# Patient Record
Sex: Female | Born: 1995 | Race: White | Hispanic: No | Marital: Single | Smoking: Former smoker
Health system: Southern US, Community
[De-identification: ages and names within clinical notes are randomized; demographics above are authoritative.]

## PROBLEM LIST (undated history)

## (undated) DIAGNOSIS — Z8709 Personal history of other diseases of the respiratory system: Secondary | ICD-10-CM

## (undated) DIAGNOSIS — R55 Syncope and collapse: Secondary | ICD-10-CM

## (undated) HISTORY — PX: DILATION AND CURETTAGE OF UTERUS: SHX78

## (undated) HISTORY — DX: Syncope and collapse: R55

## (undated) HISTORY — DX: Personal history of other diseases of the respiratory system: Z87.09

---
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
## 2016-03-12 NOTE — L&D Delivery Note (Signed)
Patient is 21 y.o. G2P0010 [redacted]w[redacted]d admitted for SOL. AROM at 1051.    Delivery Note At 9:09 PM a viable female was delivered via Vaginal, Vacuum (Extractor) (Presentation: ROA).  APGAR: 8, 9; weight pending.   Placenta status: spontaneous, intact.  Cord: 3 vessel  Anesthesia:  Epidural Episiotomy: None Lacerations: Labial;1st degree Suture Repair: 3.0 vicryl Est. Blood Loss (mL): 250  Mom to postpartum.  Baby to Couplet care / Skin to Skin.    Upon arrival patient was complete and pushing. She pushed with good maternal effort for three hours and baby remained at +3 station.  Risks and benefits of vaccuum assist were discussed with patient and she agreed to proceed.  A Kiwi suction was applied to neonate head and traction applied during maternal pushing effort.  A  viable female infant in cephalic, ROA position was then delivered without complication. No nuchal cord present.  Baby delivered without difficulty (anterior shoulder delivered with ease), was noted to have good tone and placed on maternal abdomen for oral suctioning, drying and stimulation. Delayed cord clamping performed. Placenta delivered spontaneously with gentle cord traction. Fundus firm with massage and Pitocin. Perineum inspected and found to have right labial first degree laceration, which was repaired with 3-0 vicryl with adequate hemostasis achieved.  Counts of sharps, instruments, and lap pads were all correct.   Larene Beach, DO PGY-2 Family Medicine Resident 11/24/2016, 9:26 PM

## 2016-07-06 ENCOUNTER — Encounter: Payer: Self-pay | Admitting: Certified Nurse Midwife

## 2016-07-18 ENCOUNTER — Ambulatory Visit (INDEPENDENT_AMBULATORY_CARE_PROVIDER_SITE_OTHER): Payer: Medicaid Other | Admitting: Certified Nurse Midwife

## 2016-07-18 ENCOUNTER — Other Ambulatory Visit (HOSPITAL_COMMUNITY)
Admission: RE | Admit: 2016-07-18 | Discharge: 2016-07-18 | Disposition: A | Discharge status: Home / Self Care | Payer: Medicaid Other | Source: Ambulatory Visit | Attending: Certified Nurse Midwife | Admitting: Certified Nurse Midwife

## 2016-07-18 ENCOUNTER — Encounter: Payer: Self-pay | Admitting: Certified Nurse Midwife

## 2016-07-18 VITALS — BP 110/65 | HR 91 | Ht 62.0 in | Wt 134.2 lb

## 2016-07-18 DIAGNOSIS — Z3481 Encounter for supervision of other normal pregnancy, first trimester: Secondary | ICD-10-CM | POA: Diagnosis not present

## 2016-07-18 DIAGNOSIS — O0930 Supervision of pregnancy with insufficient antenatal care, unspecified trimester: Secondary | ICD-10-CM

## 2016-07-18 DIAGNOSIS — B9689 Other specified bacterial agents as the cause of diseases classified elsewhere: Secondary | ICD-10-CM | POA: Insufficient documentation

## 2016-07-18 DIAGNOSIS — Z3480 Encounter for supervision of other normal pregnancy, unspecified trimester: Secondary | ICD-10-CM | POA: Insufficient documentation

## 2016-07-18 DIAGNOSIS — O0931 Supervision of pregnancy with insufficient antenatal care, first trimester: Secondary | ICD-10-CM

## 2016-07-18 DIAGNOSIS — Z87898 Personal history of other specified conditions: Secondary | ICD-10-CM

## 2016-07-18 MED ORDER — PRENATE PIXIE 10-0.6-0.4-200 MG PO CAPS: 1.0000 | ORAL_CAPSULE | Freq: Every day | ORAL | 12 refills | Status: DC

## 2016-07-18 NOTE — Progress Notes (Signed)
Patient reports fetal movement, denies pain, bleeding and contractions.

## 2016-07-18 NOTE — Progress Notes (Signed)
Subjective:    Charlene Joseph is being seen today for her first obstetrical visit.  This is not a planned pregnancy but happy about. She is at 2344w4d gestation. Her obstetrical history is significant for late to prenatal care, asthma as a young child, syncopy,and dilation & curretage. Relationship with FOB: significant other, living together, Christ present at visit today.  Patient does intend to breast and bottle feed. Pregnancy history fully reviewed.  The information documented in the HPI was reviewed and verified.  Menstrual History: OB History    Gravida Para Term Preterm AB Living   2       1 0   SAB TAB Ectopic Multiple Live Births     1            Menarche age: [age] Patient's last menstrual period was 02/25/2016.  Period is irregular, lasts from 3 to 7 days with moderate to light flow. Patient does report dysmenorrhea after TAB with  Her menstrual cycle. Period Duration (Days): 3 to 7 Period Pattern: (!) Irregular Menstrual Flow: Moderate Menstrual Control: Maxi pad, Tampon Menstrual Control Change Freq (Hours): every 2 hours  Dysmenorrhea: (!) Severe Dysmenorrhea Symptoms: Cramping, Headache, Nausea  Past Medical History:  Diagnosis Date   Asthma  (young child)  . Syncope 2011    Past Surgical History:  Procedure Laterality Date  . DILATION AND CURETTAGE OF UTERUS       (Not in a hospital admission) No Known Allergies  Social History  Substance Use Topics  . Smoking status: Former Smoker    Types: Cigarettes    Quit date: 2016  . Smokeless tobacco: Never Used  . Alcohol use No    Family History  Problem Relation Age of Onset  . Diabetes Maternal Grandmother      Review of Systems Constitutional: negative for weight loss Gastrointestinal: negative for vomiting,  Genitourinary:negative for genital lesions and vaginal discharge and dysuria Musculoskeletal:negative for back pain Behavioral/Psych: negative for abusive relationship, depression, illegal drug usage  and tobacco use    Objective:    BP 110/65   Pulse 91   Ht 5\' 2"  (1.575 m)   Wt 134 lb 3.2 oz (60.9 kg)   LMP 02/25/2016   BMI 24.55 kg/m  General Appearance:    Alert, cooperative, no distress, appears stated age  Head:    Normocephalic, without obvious abnormality, atraumatic  Eyes:    PERRL, conjunctiva/corneas clear, EOM's intact, fundi    benign, both eyes  Ears:    Normal TM's and external ear canals, both ears  Nose:   Nares normal, septum midline, mucosa normal, no drainage    or sinus tenderness  Throat:   Lips, mucosa, and tongue normal; teeth and gums normal  Neck:   Supple, symmetrical, trachea midline, no adenopathy;    thyroid:  no enlargement/tenderness/nodules; no carotid   bruit or JVD  Back:     Symmetric, no curvature, ROM normal, no CVA tenderness  Lungs:     Clear to auscultation bilaterally, respirations unlabored  Chest Wall:    No tenderness or deformity   Heart:    Regular rate and rhythm, S1 and S2 normal, no murmur, rub   or gallop  Breast Exam:    No tenderness, masses, or nipple abnormality  Abdomen:     Soft, non-tender, tenderness noted in lower left quadrant , bowel sounds active all four quadrants,    no masses, no organomegaly  Genitalia:    Normal female without lesion, discharge  or tenderness  Extremities:   Extremities normal, atraumatic, no cyanosis or edema  Pulses:   2+ and symmetric all extremities  Skin:   Skin color, texture, turgor normal, no rashes or lesions  Lymph nodes:   Cervical, supraclavicular, and axillary nodes normal  Neurologic:   CNII-XII intact, normal strength, sensation and reflexes    throughout     Cervix: long thick closed and posterior. FHR: 152 by doppler. FH: 20cm    Lab Review Urine pregnancy test n/a Labs reviewed n/a Radiologic studies reviewed yes Assessment:    Pregnancy at [redacted]w[redacted]d weeks ,S=D   1. Supervision of other normal pregnancy, antepartum      Doing well - Cervicovaginal ancillary only -  Hemoglobin A1c - Hemoglobinopathy evaluation - Vitamin D (25 hydroxy) - Obstetric Panel, Including HIV - MaterniT21 PLUS Core+SCA - Culture, OB Urine - Varicella zoster antibody, IgG - Cystic Fibrosis Mutation 97 - AFP, Serum, Open Spina Bifida - [place] MFM OB COMP + 14 WK; Future - Prenat-FeAsp-Meth-FA-DHA w/o A (PRENATE PIXIE) 10-0.6-0.4-200 MG CAPS; Take 1 tablet by mouth daily.  Dispense: 30 capsule; Refill: 12  2. Late prenatal care     At 20 weeks  3. Hx of syncope      No recent episodes.  - Ambulatory referral to Cardiology   Prenatal vitamins.  Counseling provided regarding continued use of seat belts, cessation of alcohol consumption, smoking or use of illicit drugs; infection precautions i.e., influenza/TDAP immunizations, toxoplasmosis,CMV, parvovirus, listeria and varicella; workplace safety, exercise during pregnancy; routine dental care, safe medications, sexual activity, hot tubs, saunas, pools, travel, caffeine use, fish and methlymercury, potential toxins, hair treatments, varicose veins Weight gain recommendations per IOM guidelines reviewed: underweight/BMI< 18.5--> gain 28 - 40 lbs; normal weight/BMI 18.5 - 24.9--> gain 25 - 35 lbs; overweight/BMI 25 - 29.9--> gain 15 - 25 lbs; obese/BMI >30->gain  11 - 20 lbs Problem list reviewed and updated. FIRST/CF mutation testing/NIPT/QUAD SCREEN/fragile X/[name] population testing/Spinal muscular atrophy discussed: requested. Role of ultrasound in pregnancy discussed; fetal survey: requested. Amniocentesis discussed: not indicated at this time..   Meds ordered this encounter  Medications  . Prenatal Vit-Fe Fumarate-FA (MULTIVITAMIN-PRENATAL) 27-0.8 MG TABS tablet    Sig: Take 1 tablet by mouth daily at 12 noon.  . Prenat-FeAsp-Meth-FA-DHA w/o A (PRENATE PIXIE) 10-0.6-0.4-200 MG CAPS    Sig: Take 1 tablet by mouth daily.    Dispense:  30 capsule    Refill:  12         Follow up in 4 weeks ROB 50% of 30 min  visit spent on counseling and coordination of care.

## 2016-07-19 LAB — CERVICOVAGINAL ANCILLARY ONLY
BACTERIAL VAGINITIS: POSITIVE — AB
Candida vaginitis: NEGATIVE
Chlamydia: NEGATIVE
Neisseria Gonorrhea: NEGATIVE
Trichomonas: NEGATIVE

## 2016-07-22 ENCOUNTER — Other Ambulatory Visit: Payer: Self-pay | Admitting: Certified Nurse Midwife

## 2016-07-22 DIAGNOSIS — B9689 Other specified bacterial agents as the cause of diseases classified elsewhere: Secondary | ICD-10-CM

## 2016-07-22 DIAGNOSIS — N760 Acute vaginitis: Principal | ICD-10-CM

## 2016-07-22 LAB — CULTURE, OB URINE

## 2016-07-22 LAB — URINE CULTURE, OB REFLEX

## 2016-07-22 MED ORDER — METRONIDAZOLE 500 MG PO TABS: 500.0000 mg | ORAL_TABLET | Freq: Two times a day (BID) | ORAL | 0 refills | Status: DC

## 2016-07-23 ENCOUNTER — Other Ambulatory Visit: Payer: Self-pay | Admitting: Certified Nurse Midwife

## 2016-07-23 DIAGNOSIS — Z3480 Encounter for supervision of other normal pregnancy, unspecified trimester: Secondary | ICD-10-CM

## 2016-07-23 LAB — MATERNIT21 PLUS CORE+SCA
CHROMOSOME 13: NEGATIVE
CHROMOSOME 18: NEGATIVE
CHROMOSOME 21: NEGATIVE
Y Chromosome: DETECTED

## 2016-07-25 ENCOUNTER — Other Ambulatory Visit: Payer: Self-pay | Admitting: Certified Nurse Midwife

## 2016-07-25 DIAGNOSIS — Z3480 Encounter for supervision of other normal pregnancy, unspecified trimester: Secondary | ICD-10-CM

## 2016-07-25 LAB — AFP, SERUM, OPEN SPINA BIFIDA
AFP MOM: 0.79
AFP VALUE AFPOSL: 47.7 ng/mL
Gest. Age on Collection Date: 20 weeks
Maternal Age At EDD: 21.1 yr
OSBR RISK 1 IN: 10000
Test Results:: NEGATIVE
Weight: 134 [lb_av]

## 2016-07-25 LAB — OBSTETRIC PANEL, INCLUDING HIV
ANTIBODY SCREEN: NEGATIVE
BASOS: 0 %
Basophils Absolute: 0 10*3/uL (ref 0.0–0.2)
EOS (ABSOLUTE): 0 10*3/uL (ref 0.0–0.4)
Eos: 0 %
HEMATOCRIT: 35.7 % (ref 34.0–46.6)
HEP B S AG: NEGATIVE
HIV SCREEN 4TH GENERATION: NONREACTIVE
Hemoglobin: 12.4 g/dL (ref 11.1–15.9)
Immature Grans (Abs): 0 10*3/uL (ref 0.0–0.1)
Immature Granulocytes: 0 %
Lymphocytes Absolute: 2.3 10*3/uL (ref 0.7–3.1)
Lymphs: 24 %
MCH: 32.1 pg (ref 26.6–33.0)
MCHC: 34.7 g/dL (ref 31.5–35.7)
MCV: 93 fL (ref 79–97)
MONOCYTES: 6 %
Monocytes Absolute: 0.6 10*3/uL (ref 0.1–0.9)
NEUTROS ABS: 6.5 10*3/uL (ref 1.4–7.0)
Neutrophils: 70 %
Platelets: 223 10*3/uL (ref 150–379)
RBC: 3.86 x10E6/uL (ref 3.77–5.28)
RDW: 13.1 % (ref 12.3–15.4)
RH TYPE: POSITIVE
RPR: NONREACTIVE
RUBELLA: 2.42 {index} (ref >0.99)
WBC: 9.4 10*3/uL (ref 3.4–10.8)

## 2016-07-25 LAB — CYSTIC FIBROSIS MUTATION 97: Interpretation: NOT DETECTED

## 2016-07-25 LAB — HEMOGLOBINOPATHY EVALUATION
HEMOGLOBIN F QUANTITATION: 0 % (ref 0.0–2.0)
HGB A: 97.3 % (ref 96.4–98.8)
HGB C: 0 %
HGB S: 0 %
HGB VARIANT: 0 %
Hemoglobin A2 Quantitation: 2.7 % (ref 1.8–3.2)

## 2016-07-25 LAB — VITAMIN D 25 HYDROXY (VIT D DEFICIENCY, FRACTURES): VIT D 25 HYDROXY: 41.9 ng/mL (ref 30.0–100.0)

## 2016-07-25 LAB — HEMOGLOBIN A1C
ESTIMATED AVERAGE GLUCOSE: 94 mg/dL
Hgb A1c MFr Bld: 4.9 % (ref 4.8–5.6)

## 2016-07-25 LAB — VARICELLA ZOSTER ANTIBODY, IGG: VARICELLA: 3914 {index} (ref >165)

## 2016-07-30 ENCOUNTER — Ambulatory Visit (HOSPITAL_COMMUNITY)
Admission: RE | Admit: 2016-07-30 | Discharge: 2016-07-30 | Disposition: A | Discharge status: Home / Self Care | Payer: Medicaid Other | Source: Ambulatory Visit | Attending: Certified Nurse Midwife | Admitting: Certified Nurse Midwife

## 2016-07-30 ENCOUNTER — Other Ambulatory Visit: Payer: Self-pay | Admitting: Certified Nurse Midwife

## 2016-07-30 DIAGNOSIS — Z363 Encounter for antenatal screening for malformations: Secondary | ICD-10-CM | POA: Diagnosis present

## 2016-07-30 DIAGNOSIS — Z3480 Encounter for supervision of other normal pregnancy, unspecified trimester: Secondary | ICD-10-CM | POA: Insufficient documentation

## 2016-07-30 DIAGNOSIS — Z3A22 22 weeks gestation of pregnancy: Secondary | ICD-10-CM | POA: Diagnosis not present

## 2016-07-30 DIAGNOSIS — O0932 Supervision of pregnancy with insufficient antenatal care, second trimester: Secondary | ICD-10-CM | POA: Insufficient documentation

## 2016-08-02 ENCOUNTER — Other Ambulatory Visit: Payer: Self-pay | Admitting: Certified Nurse Midwife

## 2016-08-02 DIAGNOSIS — Z3480 Encounter for supervision of other normal pregnancy, unspecified trimester: Secondary | ICD-10-CM

## 2016-08-07 ENCOUNTER — Encounter: Payer: Self-pay | Admitting: Cardiology

## 2016-08-08 ENCOUNTER — Encounter: Payer: Self-pay | Admitting: Cardiology

## 2016-08-08 ENCOUNTER — Ambulatory Visit (INDEPENDENT_AMBULATORY_CARE_PROVIDER_SITE_OTHER): Payer: Medicaid Other | Admitting: Cardiology

## 2016-08-08 VITALS — BP 104/68 | HR 84 | Ht 62.0 in | Wt 138.4 lb

## 2016-08-08 DIAGNOSIS — R079 Chest pain, unspecified: Secondary | ICD-10-CM

## 2016-08-08 DIAGNOSIS — R55 Syncope and collapse: Secondary | ICD-10-CM

## 2016-08-08 NOTE — Patient Instructions (Signed)
Medication Instructions:  Your physician recommends that you continue on your current medications as directed. Please refer to the Current Medication list given to you today.   Labwork: None Ordered   Testing/Procedures: Your physician has requested that you have an echocardiogram. Echocardiography is a painless test that uses sound waves to create images of your heart. It provides your doctor with information about the size and shape of your heart and how well your heart's chambers and valves are working. This procedure takes approximately one hour. There are no restrictions for this procedure.    Follow-Up: Your physician recommends that you schedule a follow-up appointment in: as needed  Any Other Special Instructions Will Be Listed Below (If Applicable).     If you need a refill on your cardiac medications before your next appointment, please call your pharmacy.   Thank you for choosing  [facility]  Corrine CMA AAMA

## 2016-08-08 NOTE — Progress Notes (Signed)
Cardiology Office Note NEW PATIENT VISIT   Date:  08/08/2016   ID:  Charlene Joseph, DOB Jun 18, 1995, MRN [id number]  PCP:  Patient, No Pcp Per  Cardiologist:  New Dr. Eldridge Dace    Chief Complaint  Patient presents with  . Loss of Consciousness  . Shortness of Breath      History of Present Illness: Charlene Joseph is a 21 y.o. female who presents for hx of syncope now pregnant.   No recent episodes of syncope.   She is [redacted] weeks pregnant due 12/01/16.  She has hx of syncope in middle school several times usually when out in field playing.  She did have an echo back then.  Syncope then described as feeling hollow in arms and legs and then passes out.  No syncope in some time but now she has episodes of feeling lightheaded and the hollowness in arms and legs.  We discussed importance of keeping hydrated.  No palpitations.  Some SOB.  occ chest pain.   Today she found out she is having a boy.  Past Medical History:  Diagnosis Date  . History of asthma    patient had as child ,been about 6 to 7 years sine one.   . Syncope 2011    Past Surgical History:  Procedure Laterality Date  . DILATION AND CURETTAGE OF UTERUS       Current Outpatient Prescriptions  Medication Sig Dispense Refill  . metroNIDAZOLE (FLAGYL) 500 MG tablet Take 1 tablet (500 mg total) by mouth 2 (two) times daily. 14 tablet 0  . Prenat-FeAsp-Meth-FA-DHA w/o A (PRENATE PIXIE) 10-0.6-0.4-200 MG CAPS Take 1 tablet by mouth daily. 30 capsule 12  . Prenatal Vit-Fe Fumarate-FA (MULTIVITAMIN-PRENATAL) 27-0.8 MG TABS tablet Take 1 tablet by mouth daily at 12 noon.     No current facility-administered medications for this visit.     Allergies:   Patient has no known allergies.    Social History:  The patient  reports that she quit smoking about 2 years ago. Her smoking use included Cigarettes. She has never used smokeless tobacco. She reports that she uses drugs, including Marijuana. She reports that she does not drink  alcohol.   Family History:  The patient's family history includes Diabetes in her maternal grandmother; Healthy in her brother and mother.    ROS:  General:no colds or fevers, + weight gain with pregnancy Skin:no rashes or ulcers HEENT:no blurred vision, no congestion CV:see HPI PUL:see HPI GI:no diarrhea constipation or melena, no indigestion GU:no hematuria, no dysuria MS:no joint pain, no claudication Neuro:no syncope, no lightheadedness Endo:no diabetes, no thyroid disease  Wt Readings from Last 3 Encounters:  08/08/16 138 lb 6.4 oz (62.8 kg)  07/18/16 134 lb 3.2 oz (60.9 kg)     PHYSICAL EXAM: VS:  BP 104/68   Pulse 84   Ht 5\' 2"  (1.575 m)   Wt 138 lb 6.4 oz (62.8 kg)   LMP 02/25/2016   SpO2 98%   BMI 25.31 kg/m  , BMI Body mass index is 25.31 kg/m. General:Pleasant affect, NAD Skin:Warm and dry, brisk capillary refill HEENT:normocephalic, sclera clear, mucus membranes moist Neck:supple, no JVD, no bruits  Heart:S1S2 RRR without murmur, gallup, rub or click Lungs:clear without rales, rhonchi, or wheezes WUJ:WJXB, non tender, + BS, do not palpate liver spleen or masses Ext:no lower ext edema, 2+ pedal pulses, 2+ radial pulses Neuro:alert and oriented, MAE, follows commands, + facial symmetry    EKG:  EKG is ordered today. The ekg  ordered today demonstrates SR normal EKG   Recent Labs: 07/18/2016: Platelets 223    Lipid Panel No results found for: CHOL, TRIG, HDL, CHOLHDL, VLDL, LDLCALC, LDLDIRECT     Other studies Reviewed: Additional studies/ records that were reviewed today include: OB OV note.   ASSESSMENT AND PLAN:  1.  Syncope hx and now dizziness similar to presyncope feeling.  Dr. Eldridge DaceVaranasi has seen as well - we will do echo to eval LV function.  If normal no need to follow up, will call results. Did discuss of keeping hydrated and eating.     Current medicines are reviewed with the patient today.  The patient Has no concerns regarding  medicines.  The following changes have been made:  See above Labs/ tests ordered today include:see above  Disposition:   FU:  see above  Signed, Nada BoozerLaura Ingold, NP  08/08/2016 3:56 PM    [facility] [address], [place], [place]  [address]/ [address] [place], [place] Phone: [phone]; Fax: [phone]  [phone]  I have reviewed assessment and plan.  Agree with above as stated.    Lance MussJayadeep Varanasi

## 2016-08-15 ENCOUNTER — Ambulatory Visit (INDEPENDENT_AMBULATORY_CARE_PROVIDER_SITE_OTHER): Payer: Medicaid Other | Admitting: Certified Nurse Midwife

## 2016-08-15 DIAGNOSIS — Z3480 Encounter for supervision of other normal pregnancy, unspecified trimester: Secondary | ICD-10-CM

## 2016-08-15 DIAGNOSIS — Z3482 Encounter for supervision of other normal pregnancy, second trimester: Secondary | ICD-10-CM

## 2016-08-15 NOTE — Progress Notes (Signed)
   PRENATAL VISIT NOTE  Subjective:  Charlene Joseph is a 21 y.o. G2P0010 at 5746w4d being seen today for ongoing prenatal care.  She is currently monitored for the following issues for this low-risk pregnancy and has Supervision of other normal pregnancy, antepartum on her problem list.  Patient reports no complaints.  Contractions: Not present. Vag. Bleeding: None.  Movement: Present. Denies leaking of fluid.   The following portions of the patient's history were reviewed and updated as appropriate: allergies, current medications, past family history, past medical history, past social history, past surgical history and problem list. Problem list updated.  Objective:   Vitals:   08/15/16 1039  BP: 106/69  Pulse: 81  Weight: 138 lb 8 oz (62.8 kg)    Fetal Status: Fetal Heart Rate (bpm): 150 Fundal Height: 24 cm Movement: Present     General:  Alert, oriented and cooperative. Patient is in no acute distress.  Skin: Skin is warm and dry. No rash noted.   Cardiovascular: Normal heart rate noted  Respiratory: Normal respiratory effort, no problems with respiration noted  Abdomen: Soft, gravid, appropriate for gestational age. Pain/Pressure: Absent     Pelvic:  Cervical exam deferred        Extremities: Normal range of motion.  Edema: None  Mental Status: Normal mood and affect. Normal behavior. Normal judgment and thought content.   Assessment and Plan:  Pregnancy: G2P0010 at 5746w4d  1. Supervision of other normal pregnancy, antepartum     Doing well.   Preterm labor symptoms and general obstetric precautions including but not limited to vaginal bleeding, contractions, leaking of fluid and fetal movement were reviewed in detail with the patient. Please refer to After Visit Summary for other counseling recommendations.  Return in about 4 weeks (around 09/12/2016) for ROB, 2 hr OGTT.   Charlene Joseph, CNM

## 2016-08-15 NOTE — Patient Instructions (Addendum)
AREA PEDIATRIC/FAMILY [place] [address], [address] [place], [place]  [address] Phone - [phone]   Fax - [phone]  [facility] [place] [address] [place], [place] [address] Phone - [phone]   Fax - [place] [address] [place], [place]  [address] Phone - [phone]   Fax - [phone]  [place]. [address] [place] [address] [place], [place]  [address] Phone - [phone]   Fax - [phone]  [place] [address] [place], [place]  [address] Phone - [phone]   Fax - [phone]  [facility] [address] [place], [place]  [address] Phone - [phone]   Fax - [place] [address], [place], [place]  [address] Phone - [phone]   Fax - [phone]  [place] [address] [place], [place] [address] [place], [place]  [address] Phone - [phone]   Fax - [place] [address] [place], [place]  [address] Phone - [phone]   Fax - [phone] [facility] [place]. [address] [place], [place]  [address] Phone - [phone]   Fax - [phone]  [facility] [place] [address] [place], [place]  [address] Phone - [phone]   Fax - [phone]  [facility] AT [place], [place], [place]  [address] Phone - [phone]   Fax - [place] [address], [place], [place]  [address] Phone - [phone]   Fax - [phone]  [facility] [address] [place], [place]  [address] Phone - [place], [place]  [address] Phone - [phone]   Fax - [employer] [address], [place], [place]  [address] Phone - [phone]   Fax - [phone]  [place] [address] [place], [place]  [address] Phone - [phone]   Fax - [place]. [place], [place]  [address] Phone - [phone]   Fax - Amesti [place], [place], [place]  [address] Phone - [phone]   Fax - [place] [address] [place], [place]  [address] Phone - [phone]   Fax - [phone]  DAVID RUBIN [address] [place], [place]  [address] Phone - [phone]   Fax - [place]. [address], [place], [place]  [address] Phone - [phone]   Fax - [phone]  [place] [address] [place], [place]  [address] Phone - [phone]   Fax - [phone] Arnaldo Natal [address] [place], [place]  [address] Phone - [phone]   Fax - [place] [address] [place],   [address] Phone - [phone]   Fax - [place] [address] [place],   [address] Phone - [phone]   Fax - [phone]  [place] MD [address] [place] [address] Phone [phone]  Fax [phone]   Second Trimester of Pregnancy The second trimester is from week 13 through week 89,  month 4 through 6. This is often the time in pregnancy that you feel your best. Often times, morning sickness has lessened or quit. You may have more energy, and you may get hungry more often. Your unborn baby (fetus) is growing rapidly. At the end of the sixth month, he or she is about 9 inches long and weighs about 1 pounds. You will likely feel the baby move (quickening) between 18 and 20 weeks of  pregnancy. Follow these instructions at home:  Avoid all smoking, herbs, and alcohol. Avoid drugs not approved by your doctor.  Do not use any tobacco products, including cigarettes, chewing tobacco, and electronic cigarettes. If you need help quitting, ask your doctor. You may get counseling or other support to help you quit.  Only take medicine as told by your doctor. Some medicines are safe and some are not during pregnancy.  Exercise only as told by your doctor. Stop exercising if you start having cramps.  Eat regular, healthy meals.  Wear a good support bra if your breasts are tender.  Do not use hot tubs, steam rooms, or saunas.  Wear your seat belt when driving.  Avoid raw meat, uncooked cheese, and liter boxes and soil used by cats.  Take your prenatal vitamins.  Take 1500-2000 milligrams of calcium daily starting at the 20th week of pregnancy until you deliver your baby.  Try taking medicine that helps you poop (stool softener) as needed, and if your doctor approves. Eat more fiber by eating fresh fruit, vegetables, and whole grains. Drink enough fluids to keep your pee (urine) clear or pale yellow.  Take warm water baths (sitz baths) to soothe pain or discomfort caused by hemorrhoids. Use hemorrhoid cream if your doctor approves.  If you have puffy, bulging veins (varicose veins), wear support hose. Raise (elevate) your feet for 15 minutes, 3-4 times a day. Limit salt in your diet.  Avoid heavy lifting, wear low heals, and sit up straight.  Rest with your legs raised if you have leg cramps or low back pain.  Visit your dentist if you have not gone during your pregnancy. Use a soft toothbrush to brush your teeth. Be gentle when you floss.  You can have sex (intercourse) unless your doctor tells you not to.  Go to your doctor visits. Get help if:  You feel dizzy.  You have mild cramps or pressure in your lower belly (abdomen).  You have a nagging pain in your  belly area.  You continue to feel sick to your stomach (nauseous), throw up (vomit), or have watery poop (diarrhea).  You have bad smelling fluid coming from your vagina.  You have pain with peeing (urination). Get help right away if:  You have a fever.  You are leaking fluid from your vagina.  You have spotting or bleeding from your vagina.  You have severe belly cramping or pain.  You lose or gain weight rapidly.  You have trouble catching your breath and have chest pain.  You notice sudden or extreme puffiness (swelling) of your face, hands, ankles, feet, or legs.  You have not felt the baby move in over an hour.  You have severe headaches that do not go away with medicine.  You have vision changes. This information is not intended to replace advice given to you by your health care provider. Make sure you discuss any questions you have with your health care provider. Document Released: 05/23/2009 Document Revised: 08/04/2015 Document Reviewed: 04/29/2012 Elsevier Interactive Patient   Education  2017 [employer]  Before [facility] Before your baby arrives it is important to:  Have all of the supplies that you will need to care for your baby.  Know where to go if there is an emergency.  Discuss the baby's arrival with other family members.  What supplies will I need?  It is recommended that you have the following supplies: Large Items  Crib.  Crib mattress.  Rear-facing infant car seat. If possible, have a trained professional check to make sure that it is installed correctly.  Feeding  6-8 bottles that are 4-5 oz in size.  6-8 nipples.  Bottle brush.  Sterilizer, or a large pan or kettle with a lid.  A way to boil and cool water.  If you will be breastfeeding: ? Breast pump. ? Nipple cream. ? Nursing bra. ? Breast pads. ? Breast shields.  If you will be formula feeding: ? Formula. ? Measuring cups. ? Measuring  spoons.  Bathing  Mild baby soap and baby shampoo.  Petroleum jelly.  Soft cloth towel and washcloth.  Hooded towel.  Cotton balls.  Bath basin.  Other Supplies  Rectal thermometer.  Bulb syringe.  Baby wipes or washcloths for diaper changes.  Diaper bag.  Changing pad.  Clothing, including one-piece outfits and pajamas.  Baby nail clippers.  Receiving blankets.  Mattress pad and sheets for the crib.  Night-light for the baby's room.  Baby monitor.  2 or 3 pacifiers.  Either 24-36 cloth diapers and waterproof diaper covers or a box of disposable diapers. You may need to use as many as 10-12 diapers per day.  How do I prepare for an emergency? Prepare for an emergency by:  Knowing how to get to the nearest hospital.  Listing the phone numbers of your baby's health care providers near your home phone and in your cell phone.  How do I prepare my family?  Decide how to handle visitors.  If you have other children: ? Talk with them about the baby coming home. Ask them how they feel about it. ? Read a book together about being a new big brother or sister. ? Find ways to let them help you prepare for the new baby. ? Have someone ready to care for them while you are in the hospital. This information is not intended to replace advice given to you by your health care provider. Make sure you discuss any questions you have with your health care provider. Document Released: 02/09/2008 Document Revised: 08/04/2015 Document Reviewed: 02/03/2014 Elsevier Interactive Patient Education  2018 [employer] Contraception Choices Contraception (birth control) is the use of any methods or devices to prevent pregnancy. Below are some methods to help avoid pregnancy. Hormonal methods  Contraceptive implant. This is a thin, plastic tube containing progesterone hormone. It does not contain estrogen hormone. Your health care provider inserts the tube in the inner part of the  upper arm. The tube can remain in place for up to 3 years. After 3 years, the implant must be removed. The implant prevents the ovaries from releasing an egg (ovulation), thickens the cervical mucus to prevent sperm from entering the uterus, and thins the lining of the inside of the uterus.  Progesterone-only injections. These injections are given every 3 months by your health care provider to prevent pregnancy. This synthetic progesterone hormone stops the ovaries from releasing eggs. It also thickens cervical mucus and changes the uterine lining. This makes it harder for sperm to survive  in the uterus.  Birth control pills. These pills contain estrogen and progesterone hormone. They work by preventing the ovaries from releasing eggs (ovulation). They also cause the cervical mucus to thicken, preventing the sperm from entering the uterus. Birth control pills are prescribed by a health care provider.Birth control pills can also be used to treat heavy periods.  Minipill. This type of birth control pill contains only the progesterone hormone. They are taken every day of each month and must be prescribed by your health care provider.  Birth control patch. The patch contains hormones similar to those in birth control pills. It must be changed once a week and is prescribed by a health care provider.  Vaginal ring. The ring contains hormones similar to those in birth control pills. It is left in the vagina for 3 weeks, removed for 1 week, and then a new one is put back in place. The patient must be comfortable inserting and removing the ring from the vagina.A health care provider's prescription is necessary.  Emergency contraception. Emergency contraceptives prevent pregnancy after unprotected sexual intercourse. This pill can be taken right after sex or up to 5 days after unprotected sex. It is most effective the sooner you take the pills after having sexual intercourse. Most emergency contraceptive pills  are available without a prescription. Check with your pharmacist. Do not use emergency contraception as your only form of birth control. Barrier methods  Female condom. This is a thin sheath (latex or rubber) that is worn over the penis during sexual intercourse. It can be used with spermicide to increase effectiveness.  Female condom. This is a soft, loose-fitting sheath that is put into the vagina before sexual intercourse.  Diaphragm. This is a soft, latex, dome-shaped barrier that must be fitted by a health care provider. It is inserted into the vagina, along with a spermicidal jelly. It is inserted before intercourse. The diaphragm should be left in the vagina for 6 to 8 hours after intercourse.  Cervical cap. This is a round, soft, latex or plastic cup that fits over the cervix and must be fitted by a health care provider. The cap can be left in place for up to 48 hours after intercourse.  Sponge. This is a soft, circular piece of polyurethane foam. The sponge has spermicide in it. It is inserted into the vagina after wetting it and before sexual intercourse.  Spermicides. These are chemicals that kill or block sperm from entering the cervix and uterus. They come in the form of creams, jellies, suppositories, foam, or tablets. They do not require a prescription. They are inserted into the vagina with an applicator before having sexual intercourse. The process must be repeated every time you have sexual intercourse. Intrauterine contraception  Intrauterine device (IUD). This is a T-shaped device that is put in a woman's uterus during a menstrual period to prevent pregnancy. There are 2 types: ? Copper IUD. This type of IUD is wrapped in copper wire and is placed inside the uterus. Copper makes the uterus and fallopian tubes produce a fluid that kills sperm. It can stay in place for 10 years. ? Hormone IUD. This type of IUD contains the hormone progestin (synthetic progesterone). The hormone  thickens the cervical mucus and prevents sperm from entering the uterus, and it also thins the uterine lining to prevent implantation of a fertilized egg. The hormone can weaken or kill the sperm that get into the uterus. It can stay in place for 3-5 years, depending on  which type of IUD is used. Permanent methods of contraception  Female tubal ligation. This is when the woman's fallopian tubes are surgically sealed, tied, or blocked to prevent the egg from traveling to the uterus.  Hysteroscopic sterilization. This involves placing a small coil or insert into each fallopian tube. Your doctor uses a technique called hysteroscopy to do the procedure. The device causes scar tissue to form. This results in permanent blockage of the fallopian tubes, so the sperm cannot fertilize the egg. It takes about 3 months after the procedure for the tubes to become blocked. You must use another form of birth control for these 3 months.  Female sterilization. This is when the female has the tubes that carry sperm tied off (vasectomy).This blocks sperm from entering the vagina during sexual intercourse. After the procedure, the man can still ejaculate fluid (semen). Natural planning methods  Natural family planning. This is not having sexual intercourse or using a barrier method (condom, diaphragm, cervical cap) on days the woman could become pregnant.  Calendar method. This is keeping track of the length of each menstrual cycle and identifying when you are fertile.  Ovulation method. This is avoiding sexual intercourse during ovulation.  Symptothermal method. This is avoiding sexual intercourse during ovulation, using a thermometer and ovulation symptoms.  Post-ovulation method. This is timing sexual intercourse after you have ovulated. Regardless of which type or method of contraception you choose, it is important that you use condoms to protect against the transmission of sexually transmitted infections (STIs).  Talk with your health care provider about which form of contraception is most appropriate for you. This information is not intended to replace advice given to you by your health care provider. Make sure you discuss any questions you have with your health care provider. Document Released: 02/26/2005 Document Revised: 08/04/2015 Document Reviewed: 08/21/2012 Elsevier Interactive Patient Education  2017 [employer].

## 2016-08-15 NOTE — Progress Notes (Signed)
Patient reports good fetal movement, denies pain. 

## 2016-08-17 ENCOUNTER — Other Ambulatory Visit: Payer: Self-pay

## 2016-08-17 ENCOUNTER — Ambulatory Visit (HOSPITAL_COMMUNITY): Payer: Medicaid Other | Attending: Cardiovascular Disease

## 2016-08-17 DIAGNOSIS — R079 Chest pain, unspecified: Secondary | ICD-10-CM | POA: Diagnosis present

## 2016-09-10 ENCOUNTER — Ambulatory Visit (INDEPENDENT_AMBULATORY_CARE_PROVIDER_SITE_OTHER): Payer: Medicaid Other | Admitting: Certified Nurse Midwife

## 2016-09-10 ENCOUNTER — Encounter: Payer: Self-pay | Admitting: Certified Nurse Midwife

## 2016-09-10 ENCOUNTER — Other Ambulatory Visit: Payer: Medicaid Other

## 2016-09-10 VITALS — BP 109/72 | HR 105 | Wt 140.0 lb

## 2016-09-10 DIAGNOSIS — Z3483 Encounter for supervision of other normal pregnancy, third trimester: Secondary | ICD-10-CM

## 2016-09-10 DIAGNOSIS — Z3480 Encounter for supervision of other normal pregnancy, unspecified trimester: Secondary | ICD-10-CM

## 2016-09-10 NOTE — Patient Instructions (Signed)
AREA PEDIATRIC/FAMILY PRACTICE PHYSICIANS  [facility] [address] [place], [place]  [address] Phone - [phone]   Fax - [phone]  [facility] [address] [place], [place] [address] Phone - [phone]   Fax - [phone]  JACK AMOS [address] [place], [place]  [address] Phone - [phone]   Fax - [phone]  [facility] [address] [place], [place]  [address] Phone - [phone]   Fax - [phone]  [facility] [address] [place], [place]  [address] Phone - [phone]   Fax - [phone]  [facility] [address] [place], [place]  [address] Phone - [phone]   Fax - [phone]  [facility] [place] [address] [place], [place]  [address] Phone - [phone]   Fax - [phone]  [facility] [place] [address] [place], [place]  [address] Phone - [phone]   Fax - [phone]  [facility] [address] [place], [place]  [address] Phone - [phone]   Fax - [phone] [facility] [place] [place] [address] [place], [place]  [address] Phone - [phone]   Fax - [phone]  [facility] [address] [place], [place]  [address] Phone - [phone]   Fax - [phone]  [facility] [address] [place], [place]  [address] Phone - [phone]   Fax - [phone]  [facility] [address] [place], [place]  [address] Phone - [phone]   Fax - [phone]  SHILPA GOSRANI [address] [place], [place]  [address] Phone - [phone]  [facility] [address] [place], [place]  [address] Phone - [phone]   Fax - [phone]  [facility] [address] [place], [place]  [address] Phone - [phone]   Fax - [phone]  [facility] [address] [place], [place]  [address] Phone - [phone]   Fax - [phone]  [facility] [address] [place], [place]  [address] Phone - [phone]   Fax - [phone]   [facility] [address] [place], [place]  [address] Phone - [phone]   Fax - [phone]  [facility] [address] [place], [place]  [address] Phone - [phone]   Fax - [phone]  DAVID RUBIN [address] [place], [place]  [address] Phone - [phone]   Fax - [phone]  [facility] [address] [place], [place]  [address] Phone - [phone]   Fax - [phone]  [place] [place] [address] , [place]  [address] Phone - [phone]   Fax - [phone] [place] [facility] [place] [address] [place], [place]  [address] Phone - [phone]   Fax - [phone]  [place] [facility] [place] [facility] [address] [place], [place]  [address] Phone - [phone]   Fax - [phone]  [facility] [place] [address] [place], [place]  [address] Phone - [phone]   Fax - [phone]  [facility] [place] Charlene Flemming MD [address] [place]  [address] Phone [phone]  Fax [phone]   

## 2016-09-10 NOTE — Progress Notes (Signed)
Patient opted to get the TDAP at her next visit, information/fact sheet given to patient.

## 2016-09-10 NOTE — Progress Notes (Signed)
   PRENATAL VISIT NOTE  Subjective:  Sigurd SosZoe Wadlow is a 21 y.o. G2P0010 at 3021w2d being seen today for ongoing prenatal care.  She is currently monitored for the following issues for this low-risk pregnancy and has Supervision of other normal pregnancy, antepartum on her problem list.  Patient reports no complaints.  Contractions: Not present. Vag. Bleeding: None.  Movement: Present. Denies leaking of fluid.   The following portions of the patient's history were reviewed and updated as appropriate: allergies, current medications, past family history, past medical history, past social history, past surgical history and problem list. Problem list updated.  Objective:   Vitals:   09/10/16 0854  BP: 109/72  Pulse: (!) 105  Weight: 140 lb (63.5 kg)    Fetal Status:     Movement: Present     General:  Alert, oriented and cooperative. Patient is in no acute distress.  Skin: Skin is warm and dry. No rash noted.   Cardiovascular: Normal heart rate noted  Respiratory: Normal respiratory effort, no problems with respiration noted  Abdomen: Soft, gravid, appropriate for gestational age. Pain/Pressure: Present     Pelvic:  Cervical exam deferred        Extremities: Normal range of motion.  Edema: None  Mental Status: Normal mood and affect. Normal behavior. Normal judgment and thought content.   Assessment and Plan:  Pregnancy: G2P0010 at 5721w2d  1. Supervision of other normal pregnancy, antepartum     Doing well - Glucose Tolerance, 2 Hours w/1 Hour - CBC - HIV antibody (with reflex) - RPR  Preterm labor symptoms and general obstetric precautions including but not limited to vaginal bleeding, contractions, leaking of fluid and fetal movement were reviewed in detail with the patient. Please refer to After Visit Summary for other counseling recommendations.  Return in about 2 weeks (around 09/24/2016) for ROB.   Roe Coombsachelle A Abhay Godbolt, CNM

## 2016-09-11 ENCOUNTER — Other Ambulatory Visit: Payer: Self-pay | Admitting: Certified Nurse Midwife

## 2016-09-11 ENCOUNTER — Ambulatory Visit (HOSPITAL_COMMUNITY)
Admission: RE | Admit: 2016-09-11 | Discharge: 2016-09-11 | Disposition: A | Discharge status: Home / Self Care | Payer: Medicaid Other | Source: Ambulatory Visit | Attending: Certified Nurse Midwife | Admitting: Certified Nurse Midwife

## 2016-09-11 DIAGNOSIS — Z3480 Encounter for supervision of other normal pregnancy, unspecified trimester: Secondary | ICD-10-CM

## 2016-09-11 DIAGNOSIS — Z3A28 28 weeks gestation of pregnancy: Secondary | ICD-10-CM

## 2016-09-11 DIAGNOSIS — Z362 Encounter for other antenatal screening follow-up: Secondary | ICD-10-CM

## 2016-09-11 DIAGNOSIS — O0933 Supervision of pregnancy with insufficient antenatal care, third trimester: Secondary | ICD-10-CM | POA: Diagnosis present

## 2016-09-11 LAB — CBC
HEMATOCRIT: 37.6 % (ref 34.0–46.6)
HEMOGLOBIN: 12.6 g/dL (ref 11.1–15.9)
MCH: 32.6 pg (ref 26.6–33.0)
MCHC: 33.5 g/dL (ref 31.5–35.7)
MCV: 97 fL (ref 79–97)
Platelets: 189 10*3/uL (ref 150–379)
RBC: 3.86 x10E6/uL (ref 3.77–5.28)
RDW: 13.4 % (ref 12.3–15.4)
WBC: 8.5 10*3/uL (ref 3.4–10.8)

## 2016-09-11 LAB — GLUCOSE TOLERANCE, 2 HOURS W/ 1HR
Glucose, 1 hour: 157 mg/dL (ref 65–179)
Glucose, 2 hour: 127 mg/dL (ref 65–152)
Glucose, Fasting: 68 mg/dL (ref 65–91)

## 2016-09-11 LAB — HIV ANTIBODY (ROUTINE TESTING W REFLEX): HIV Screen 4th Generation wRfx: NONREACTIVE

## 2016-09-11 LAB — RPR: RPR Ser Ql: NONREACTIVE

## 2016-09-26 ENCOUNTER — Ambulatory Visit (INDEPENDENT_AMBULATORY_CARE_PROVIDER_SITE_OTHER): Payer: Medicaid Other | Admitting: Obstetrics

## 2016-09-26 ENCOUNTER — Encounter: Payer: Self-pay | Admitting: Obstetrics

## 2016-09-26 DIAGNOSIS — Z3480 Encounter for supervision of other normal pregnancy, unspecified trimester: Secondary | ICD-10-CM

## 2016-09-26 DIAGNOSIS — Z23 Encounter for immunization: Secondary | ICD-10-CM | POA: Diagnosis not present

## 2016-09-26 DIAGNOSIS — Z3483 Encounter for supervision of other normal pregnancy, third trimester: Secondary | ICD-10-CM

## 2016-09-26 MED ORDER — TETANUS-DIPHTH-ACELL PERTUSSIS 5-2.5-18.5 LF-MCG/0.5 IM SUSP: 0.5000 mL | Freq: Once | INTRAMUSCULAR | Status: DC

## 2016-09-26 NOTE — Progress Notes (Signed)
Subjective:  Charlene Joseph is Joseph 21 y.o. G2P0010 at 2988w4d being seen today for ongoing prenatal care.  She is currently monitored for the following issues for this low-risk pregnancy and has Supervision of other normal pregnancy, antepartum on her problem list.  Patient reports heartburn.  Contractions: Not present. Vag. Bleeding: None.  Movement: Present. Denies leaking of fluid.   The following portions of the patient's history were reviewed and updated as appropriate: allergies, current medications, past family history, past medical history, past social history, past surgical history and problem list. Problem list updated.  Objective:   Vitals:   09/26/16 1543  Weight: 142 lb 6.4 oz (64.6 kg)    Fetal Status: Fetal Heart Rate (bpm): 150   Movement: Present     General:  Alert, oriented and cooperative. Patient is in no acute distress.  Skin: Skin is warm and dry. No rash noted.   Cardiovascular: Normal heart rate noted  Respiratory: Normal respiratory effort, no problems with respiration noted  Abdomen: Soft, gravid, appropriate for gestational age. Pain/Pressure: Absent     Pelvic:  Cervical exam deferred        Extremities: Normal range of motion.  Edema: None  Mental Status: Normal mood and affect. Normal behavior. Normal judgment and thought content.   Urinalysis:      Assessment and Plan:  Pregnancy: G2P0010 at 4488w4d  1. Supervision of other normal pregnancy, antepartum Rx - Tdap vaccine greater than or equal to 7yo IM  Preterm labor symptoms and general obstetric precautions including but not limited to vaginal bleeding, contractions, leaking of fluid and fetal movement were reviewed in detail with the patient. Please refer to After Visit Summary for other counseling recommendations.  Return in about 2 weeks (around 10/10/2016) for ROB.   Brock BadHarper, Charlene Baize A, MD

## 2016-10-10 ENCOUNTER — Ambulatory Visit (INDEPENDENT_AMBULATORY_CARE_PROVIDER_SITE_OTHER): Payer: Medicaid Other | Admitting: Certified Nurse Midwife

## 2016-10-10 DIAGNOSIS — Z3483 Encounter for supervision of other normal pregnancy, third trimester: Secondary | ICD-10-CM

## 2016-10-10 DIAGNOSIS — Z3480 Encounter for supervision of other normal pregnancy, unspecified trimester: Secondary | ICD-10-CM

## 2016-10-10 NOTE — Patient Instructions (Signed)
AREA PEDIATRIC/FAMILY PRACTICE PHYSICIANS  [facility] [address], [address] [place], [place]  [address] Phone - [phone]   Fax - [phone]  [facility] [address] [place], [place] [address] Phone - [phone]   Fax - [phone]  [facility] AM[facility] [address] [place], [place]  [address] Phone - [phone]   Fax - [phone]  [facility] [address] [place], [place]  [address] Phone - [phone]   Fax - [phone]  [facility] [address] [place], [place]  [address] Phone - [phone]   Fax - [phone]  [facility] [address], [address] [place], [place]  [address] Phone - [phone]   Fax - [phone]  [facility] [address] [place], [place]  [address] Phone - [phone]   Fax - [phone]  [facility] [address], [address] [place], [place]  [address] Phone - [phone]   Fax - [phone]  [facility] [address] [place], [place]  [address] Phone - [phone]   Fax - [phone] [facility] [address] [place], [place]  [address] Phone - [phone]   Fax - [phone]  [facility] [address] [place], [place]  [address] Phone - [phone]   Fax - [phone]  [facility] [address] [place], [place]  [address] Phone - [phone]   Fax - [phone]  [facility] [address], [address] [place], [place]  [address] Phone - [phone]   Fax - [phone]  SHILPA GOSRANI [address] [place], [place]  [address] Phone - [phone]  [facility] [address] [place], [place]  [address] Phone - [phone]   Fax - [phone]  [facility] [address] [place], [place]  [address] Phone - [phone]   Fax - [phone]  [facility] [address] [place], [place]  [address] Phone - [phone]   Fax - [phone]  [facility] [address] [place], [place]  [address] Phone - [phone]   Fax - [phone]   [facility] [address] [place], [place]  [address] Phone - [phone]   Fax - [phone]  [facility] [address] [place], [place]  [address] Phone - [phone]   Fax - [phone]  DAVID RUBIN [address] [place], [place]  [address] Phone - [phone]   Fax - [phone]  [facility] [address] [place], [place]  [address] Phone - [phone]   Fax - [phone]  [facility] [address] [place], [place]  [address] Phone - [phone]   Fax - [phone] [facility] [address] [place], [place]  [address] Phone - [phone]   Fax - [phone]  [facility] [address] [place], [place]  [address] Phone - [phone]   Fax - [phone]  [facility] [address] [place],   [address] Phone - [phone]   Fax - [phone]  [facility] Charlene Flemming MD [address] [place]  [address] Phone [phone]  Fax [phone]   

## 2016-10-10 NOTE — Progress Notes (Signed)
ROB pt states no concerns today.

## 2016-10-11 NOTE — Progress Notes (Signed)
   PRENATAL VISIT NOTE  Subjective:  Sigurd SosZoe Siefken is a 21 y.o. G2P0010 at 3389w5d being seen today for ongoing prenatal care.  She is currently monitored for the following issues for this low-risk pregnancy and has Supervision of other normal pregnancy, antepartum on her problem list.  Patient reports no complaints.  Contractions: Not present. Vag. Bleeding: None.  Movement: Present. Denies leaking of fluid.   The following portions of the patient's history were reviewed and updated as appropriate: allergies, current medications, past family history, past medical history, past social history, past surgical history and problem list. Problem list updated.  Objective:   Vitals:   10/10/16 1555  BP: 116/71  Pulse: 94  Weight: 144 lb (65.3 kg)    Fetal Status: Fetal Heart Rate (bpm): 151 Fundal Height: 32 cm Movement: Present     General:  Alert, oriented and cooperative. Patient is in no acute distress.  Skin: Skin is warm and dry. No rash noted.   Cardiovascular: Normal heart rate noted  Respiratory: Normal respiratory effort, no problems with respiration noted  Abdomen: Soft, gravid, appropriate for gestational age.  Pain/Pressure: Absent     Pelvic: Cervical exam deferred        Extremities: Normal range of motion.  Edema: None  Mental Status:  Normal mood and affect. Normal behavior. Normal judgment and thought content.   Assessment and Plan:  Pregnancy: G2P0010 at 5789w5d  1. Supervision of other normal pregnancy, antepartum     Doing well.   Preterm labor symptoms and general obstetric precautions including but not limited to vaginal bleeding, contractions, leaking of fluid and fetal movement were reviewed in detail with the patient. Please refer to After Visit Summary for other counseling recommendations.  Return in about 2 weeks (around 10/24/2016) for ROB.   Roe Coombsachelle A Yohan Samons, CNM

## 2016-10-25 ENCOUNTER — Ambulatory Visit (INDEPENDENT_AMBULATORY_CARE_PROVIDER_SITE_OTHER): Payer: Medicaid Other | Admitting: Certified Nurse Midwife

## 2016-10-25 ENCOUNTER — Encounter: Payer: Self-pay | Admitting: Certified Nurse Midwife

## 2016-10-25 VITALS — BP 112/69 | HR 93 | Wt 149.0 lb

## 2016-10-25 DIAGNOSIS — Z3480 Encounter for supervision of other normal pregnancy, unspecified trimester: Secondary | ICD-10-CM

## 2016-10-25 DIAGNOSIS — O2686 Pruritic urticarial papules and plaques of pregnancy (PUPPP): Secondary | ICD-10-CM

## 2016-10-25 DIAGNOSIS — Z3483 Encounter for supervision of other normal pregnancy, third trimester: Secondary | ICD-10-CM

## 2016-10-25 MED ORDER — HYDROXYZINE PAMOATE 50 MG PO CAPS: 50.0000 mg | ORAL_CAPSULE | Freq: Three times a day (TID) | ORAL | 0 refills | Status: DC | PRN

## 2016-10-25 NOTE — Progress Notes (Signed)
   PRENATAL VISIT NOTE  Subjective:  Charlene Joseph is a 21 y.o. G2P0010 at 3w5dbeing seen today for ongoing prenatal care.  She is currently monitored for the following issues for this low-risk pregnancy and has Supervision of other normal pregnancy, antepartum on her problem list.  Patient reports no contractions, no cramping, no leaking and did have some spotting when she wipped over the weekend, nothing currently, reports recent sexual intercourse.  Reports itching that sarted on her face and moved down, nothing on her abdomen or hands/soles of feet.  Contractions: Not present. Vag. Bleeding: None.  Movement: Present. Denies leaking of fluid.   The following portions of the patient's history were reviewed and updated as appropriate: allergies, current medications, past family history, past medical history, past social history, past surgical history and problem list. Problem list updated.  Objective:   Vitals:   10/25/16 1407  BP: 112/69  Pulse: 93  Weight: 149 lb (67.6 kg)    Fetal Status: Fetal Heart Rate (bpm): 152 Fundal Height: 34 cm Movement: Present     General:  Alert, oriented and cooperative. Patient is in no acute distress.  Skin: Skin is warm and dry. No rash noted.   Cardiovascular: Normal heart rate noted  Respiratory: Normal respiratory effort, no problems with respiration noted  Abdomen: Soft, gravid, appropriate for gestational age.  Pain/Pressure: Absent     Pelvic: Cervical exam deferred        Extremities: Normal range of motion.  Edema: None  Mental Status:  Normal mood and affect. Normal behavior. Normal judgment and thought content.   Assessment and Plan:  Pregnancy: G2P0010 at 357w5d1. Supervision of other normal pregnancy, antepartum       2. PUPP (pruritic urticarial papules and plaques of pregnancy)      - Bile acids, total - Comp Met (CMET) - hydrOXYzine (VISTARIL) 50 MG capsule; Take 1 capsule (50 mg total) by mouth 3 (three) times daily as  needed.  Dispense: 30 capsule; Refill: 0  Preterm labor symptoms and general obstetric precautions including but not limited to vaginal bleeding, contractions, leaking of fluid and fetal movement were reviewed in detail with the patient. Please refer to After Visit Summary for other counseling recommendations.  Return in about 1 week (around 11/01/2016) for ROB, GBS.   RaMorene CrockerCNM

## 2016-10-25 NOTE — Progress Notes (Signed)
Pt states that she had some bleeding last Friday. States that this lasted for a couple hours. She denies having IC before this happened. She is also complaining of having some itching on hands, feet, and shoulders.

## 2016-10-26 LAB — BILE ACIDS, TOTAL: Bile Acids Total: 9.2 umol/L (ref 4.7–24.5)

## 2016-10-26 LAB — COMPREHENSIVE METABOLIC PANEL
ALBUMIN: 3.4 g/dL — AB (ref 3.5–5.5)
ALK PHOS: 101 IU/L (ref 39–117)
ALT: 45 IU/L — ABNORMAL HIGH (ref 0–32)
AST: 39 IU/L (ref 0–40)
Albumin/Globulin Ratio: 1.3 (ref 1.2–2.2)
BILIRUBIN TOTAL: 0.7 mg/dL (ref 0.0–1.2)
BUN / CREAT RATIO: 7 — AB (ref 9–23)
BUN: 4 mg/dL — ABNORMAL LOW (ref 6–20)
CHLORIDE: 104 mmol/L (ref 96–106)
CO2: 20 mmol/L (ref 20–29)
Calcium: 8.3 mg/dL — ABNORMAL LOW (ref 8.7–10.2)
Creatinine, Ser: 0.57 mg/dL (ref 0.57–1.00)
GFR calc non Af Amer: 134 mL/min/{1.73_m2} (ref >59)
GFR, EST AFRICAN AMERICAN: 154 mL/min/{1.73_m2} (ref >59)
GLOBULIN, TOTAL: 2.6 g/dL (ref 1.5–4.5)
Glucose: 76 mg/dL (ref 65–99)
Potassium: 4.2 mmol/L (ref 3.5–5.2)
SODIUM: 137 mmol/L (ref 134–144)
TOTAL PROTEIN: 6 g/dL (ref 6.0–8.5)

## 2016-11-01 ENCOUNTER — Other Ambulatory Visit (HOSPITAL_COMMUNITY)
Admission: RE | Admit: 2016-11-01 | Discharge: 2016-11-01 | Disposition: A | Discharge status: Home / Self Care | Payer: Medicaid Other | Source: Ambulatory Visit | Attending: Certified Nurse Midwife | Admitting: Certified Nurse Midwife

## 2016-11-01 ENCOUNTER — Ambulatory Visit (INDEPENDENT_AMBULATORY_CARE_PROVIDER_SITE_OTHER): Payer: Medicaid Other | Admitting: Certified Nurse Midwife

## 2016-11-01 VITALS — BP 116/75 | HR 72 | Wt 149.2 lb

## 2016-11-01 DIAGNOSIS — Z3483 Encounter for supervision of other normal pregnancy, third trimester: Secondary | ICD-10-CM

## 2016-11-01 DIAGNOSIS — Z3480 Encounter for supervision of other normal pregnancy, unspecified trimester: Secondary | ICD-10-CM

## 2016-11-01 NOTE — Progress Notes (Signed)
   PRENATAL VISIT NOTE  Subjective:  Charlene Joseph is a 21 y.o. G2P0010 at [redacted]w[redacted]d being seen today for ongoing prenatal care.  She is currently monitored for the following issues for this low-risk pregnancy and has Supervision of other normal pregnancy, antepartum on her problem list.  Patient reports no complaints.  Contractions: Not present. Vag. Bleeding: None.  Movement: Present. Denies leaking of fluid.   The following portions of the patient's history were reviewed and updated as appropriate: allergies, current medications, past family history, past medical history, past social history, past surgical history and problem list. Problem list updated.  Objective:   Vitals:   11/01/16 1453  BP: 116/75  Pulse: 72  Weight: 149 lb 3.2 oz (67.7 kg)    Fetal Status: Fetal Heart Rate (bpm): 146 Fundal Height: 35 cm Movement: Present  Presentation: Vertex  General:  Alert, oriented and cooperative. Patient is in no acute distress.  Skin: Skin is warm and dry. No rash noted.   Cardiovascular: Normal heart rate noted  Respiratory: Normal respiratory effort, no problems with respiration noted  Abdomen: Soft, gravid, appropriate for gestational age.  Pain/Pressure: Absent     Pelvic: Cervical exam performed Dilation: 1 Effacement (%): Thick Station: Ballotable  Extremities: Normal range of motion.  Edema: None  Mental Status:  Normal mood and affect. Normal behavior. Normal judgment and thought content.   Assessment and Plan:  Pregnancy: G2P0010 at [redacted]w[redacted]d  1. Supervision of other normal pregnancy, antepartum     Doing well - Strep Gp B NAA - Cervicovaginal ancillary only  Preterm labor symptoms and general obstetric precautions including but not limited to vaginal bleeding, contractions, leaking of fluid and fetal movement were reviewed in detail with the patient. Please refer to After Visit Summary for other counseling recommendations.  Return in about 1 week (around 11/08/2016) for  ROB.   Roe Coombs, CNM

## 2016-11-01 NOTE — Progress Notes (Signed)
Patient is doing well today.  

## 2016-11-02 ENCOUNTER — Telehealth: Payer: Self-pay | Admitting: *Deleted

## 2016-11-02 LAB — CERVICOVAGINAL ANCILLARY ONLY
Bacterial vaginitis: POSITIVE — AB
Candida vaginitis: NEGATIVE
Chlamydia: NEGATIVE
Neisseria Gonorrhea: NEGATIVE
TRICH (WINDOWPATH): NEGATIVE

## 2016-11-02 NOTE — Telephone Encounter (Signed)
Called Pt to inform of appt, got an error msg. stating the caller I reached is not avaliable.Charlene KitchenMarland KitchenMarland Joseph

## 2016-11-03 LAB — STREP GP B NAA: Strep Gp B NAA: NEGATIVE

## 2016-11-06 ENCOUNTER — Other Ambulatory Visit: Payer: Self-pay | Admitting: Certified Nurse Midwife

## 2016-11-06 ENCOUNTER — Telehealth: Payer: Self-pay

## 2016-11-06 DIAGNOSIS — B9689 Other specified bacterial agents as the cause of diseases classified elsewhere: Secondary | ICD-10-CM

## 2016-11-06 DIAGNOSIS — N760 Acute vaginitis: Principal | ICD-10-CM

## 2016-11-06 DIAGNOSIS — Z3480 Encounter for supervision of other normal pregnancy, unspecified trimester: Secondary | ICD-10-CM

## 2016-11-06 MED ORDER — METRONIDAZOLE 500 MG PO TABS: 500.0000 mg | ORAL_TABLET | Freq: Two times a day (BID) | ORAL | 0 refills | Status: DC

## 2016-11-06 NOTE — Telephone Encounter (Signed)
Attempted to contact about results and rx, unable to leave vm.

## 2016-11-08 ENCOUNTER — Ambulatory Visit (INDEPENDENT_AMBULATORY_CARE_PROVIDER_SITE_OTHER): Payer: Medicaid Other | Admitting: Certified Nurse Midwife

## 2016-11-08 ENCOUNTER — Encounter: Payer: Self-pay | Admitting: Certified Nurse Midwife

## 2016-11-08 VITALS — BP 129/76 | HR 70 | Wt 151.0 lb

## 2016-11-08 DIAGNOSIS — Z3483 Encounter for supervision of other normal pregnancy, third trimester: Secondary | ICD-10-CM

## 2016-11-08 DIAGNOSIS — Z3480 Encounter for supervision of other normal pregnancy, unspecified trimester: Secondary | ICD-10-CM

## 2016-11-08 NOTE — Progress Notes (Signed)
   PRENATAL VISIT NOTE  Subjective:  Charlene Joseph is a 21 y.o. G2P0010 at 6851w5d being seen today for ongoing prenatal care.  She is currently monitored for the following issues for this low-risk pregnancy and has Supervision of other normal pregnancy, antepartum on her problem list.  Patient reports no complaints.  Contractions: Not present. Vag. Bleeding: None.  Movement: Present. Denies leaking of fluid.   The following portions of the patient's history were reviewed and updated as appropriate: allergies, current medications, past family history, past medical history, past social history, past surgical history and problem list. Problem list updated.  Objective:   Vitals:   11/08/16 1545  BP: 129/76  Pulse: 70  Weight: 151 lb (68.5 kg)    Fetal Status: Fetal Heart Rate (bpm): 145; doppler Fundal Height: 36 cm Movement: Present     General:  Alert, oriented and cooperative. Patient is in no acute distress.  Skin: Skin is warm and dry. No rash noted.   Cardiovascular: Normal heart rate noted  Respiratory: Normal respiratory effort, no problems with respiration noted  Abdomen: Soft, gravid, appropriate for gestational age.  Pain/Pressure: Absent     Pelvic: Cervical exam deferred        Extremities: Normal range of motion.     Mental Status:  Normal mood and affect. Normal behavior. Normal judgment and thought content.   Assessment and Plan:  Pregnancy: G2P0010 at 6151w5d  1. Supervision of other normal pregnancy, antepartum     Doing well.    Preterm labor symptoms and general obstetric precautions including but not limited to vaginal bleeding, contractions, leaking of fluid and fetal movement were reviewed in detail with the patient. Please refer to After Visit Summary for other counseling recommendations.  Return in about 1 week (around 11/15/2016) for ROB.   Charlene Joseph, CNM

## 2016-11-15 ENCOUNTER — Ambulatory Visit (INDEPENDENT_AMBULATORY_CARE_PROVIDER_SITE_OTHER): Payer: Medicaid Other | Admitting: Certified Nurse Midwife

## 2016-11-15 ENCOUNTER — Encounter: Payer: Self-pay | Admitting: Certified Nurse Midwife

## 2016-11-15 DIAGNOSIS — Z3483 Encounter for supervision of other normal pregnancy, third trimester: Secondary | ICD-10-CM

## 2016-11-15 DIAGNOSIS — Z3480 Encounter for supervision of other normal pregnancy, unspecified trimester: Secondary | ICD-10-CM

## 2016-11-15 NOTE — Progress Notes (Signed)
Patient is in the office fro ob visit, denies contractions and reports good fetal movement.

## 2016-11-15 NOTE — Progress Notes (Signed)
   PRENATAL VISIT NOTE  Subjective:  Sigurd SosZoe Kaps is a 21 y.o. G2P0010 at 2276w5d being seen today for ongoing prenatal care.  She is currently monitored for the following issues for this low-risk pregnancy and has Supervision of other normal pregnancy, antepartum on her problem list.  Patient reports no complaints.  Contractions: Not present. Vag. Bleeding: None.  Movement: Present. Denies leaking of fluid.   The following portions of the patient's history were reviewed and updated as appropriate: allergies, current medications, past family history, past medical history, past social history, past surgical history and problem list. Problem list updated.  Objective:   Vitals:   11/15/16 1523  BP: 109/73  Pulse: 80  Weight: 153 lb 9.6 oz (69.7 kg)    Fetal Status: Fetal Heart Rate (bpm): 136; doppler Fundal Height: 36 cm Movement: Present     General:  Alert, oriented and cooperative. Patient is in no acute distress.  Skin: Skin is warm and dry. No rash noted.   Cardiovascular: Normal heart rate noted  Respiratory: Normal respiratory effort, no problems with respiration noted  Abdomen: Soft, gravid, appropriate for gestational age.  Pain/Pressure: Absent     Pelvic: Cervical exam deferred        Extremities: Normal range of motion.  Edema: None  Mental Status:  Normal mood and affect. Normal behavior. Normal judgment and thought content.   Assessment and Plan:  Pregnancy: G2P0010 at 3376w5d  1. Supervision of other normal pregnancy, antepartum     Doing well.  No complaints.  Pediatrician discussed.   Term labor symptoms and general obstetric precautions including but not limited to vaginal bleeding, contractions, leaking of fluid and fetal movement were reviewed in detail with the patient. Please refer to After Visit Summary for other counseling recommendations.  Return in about 1 week (around 11/22/2016) for ROB.   Roe Coombsachelle A Zyon Grout, CNM

## 2016-11-22 ENCOUNTER — Encounter: Payer: Self-pay | Admitting: Certified Nurse Midwife

## 2016-11-22 ENCOUNTER — Ambulatory Visit (INDEPENDENT_AMBULATORY_CARE_PROVIDER_SITE_OTHER): Payer: Medicaid Other | Admitting: Certified Nurse Midwife

## 2016-11-22 VITALS — BP 117/78 | HR 61 | Wt 153.0 lb

## 2016-11-22 DIAGNOSIS — Z3483 Encounter for supervision of other normal pregnancy, third trimester: Secondary | ICD-10-CM

## 2016-11-22 DIAGNOSIS — Z3480 Encounter for supervision of other normal pregnancy, unspecified trimester: Secondary | ICD-10-CM

## 2016-11-22 NOTE — Progress Notes (Signed)
Patient states that she thinks she lost her mucous plug this morning, reports good fetal movement and pressure.

## 2016-11-22 NOTE — Progress Notes (Signed)
   PRENATAL VISIT NOTE  Subjective:  Charlene Joseph is a 21 y.o. G2P0010 at 2564w5d being seen today for ongoing prenatal care.  She is currently monitored for the following issues for this low-risk pregnancy and has Supervision of other normal pregnancy, antepartum on her problem list.  Patient reports no complaints.  Contractions: Irritability. Vag. Bleeding: None.  Movement: Present. Denies leaking of fluid.   The following portions of the patient's history were reviewed and updated as appropriate: allergies, current medications, past family history, past medical history, past social history, past surgical history and problem list. Problem list updated.  Objective:   Vitals:   11/22/16 1422  BP: 117/78  Pulse: 61  Weight: 153 lb (69.4 kg)    Fetal Status: Fetal Heart Rate (bpm): 143; doppler Fundal Height: 37 cm Movement: Present  Presentation: Vertex  General:  Alert, oriented and cooperative. Patient is in no acute distress.  Skin: Skin is warm and dry. No rash noted.   Cardiovascular: Normal heart rate noted  Respiratory: Normal respiratory effort, no problems with respiration noted  Abdomen: Soft, gravid, appropriate for gestational age.  Pain/Pressure: Present     Pelvic: Cervical exam performed Dilation: 2 Effacement (%): 50 Station: -3  Extremities: Normal range of motion.  Edema: None  Mental Status:  Normal mood and affect. Normal behavior. Normal judgment and thought content.   Assessment and Plan:  Pregnancy: G2P0010 at 8164w5d  1. Supervision of other normal pregnancy, antepartum     Doing well.   Term labor symptoms and general obstetric precautions including but not limited to vaginal bleeding, contractions, leaking of fluid and fetal movement were reviewed in detail with the patient. Please refer to After Visit Summary for other counseling recommendations.  Return in about 1 week (around 11/29/2016) for ROB.   Roe Coombsachelle A Eliu Batch, CNM

## 2016-11-24 ENCOUNTER — Encounter (HOSPITAL_COMMUNITY): Payer: Self-pay | Admitting: *Deleted

## 2016-11-24 ENCOUNTER — Inpatient Hospital Stay (HOSPITAL_COMMUNITY): Payer: Medicaid Other | Admitting: Anesthesiology

## 2016-11-24 ENCOUNTER — Inpatient Hospital Stay (HOSPITAL_COMMUNITY)
Admission: AD | Admit: 2016-11-24 | Discharge: 2016-11-26 | DRG: 775 | Disposition: A | Discharge status: Home / Self Care | Payer: Medicaid Other | Source: Ambulatory Visit | Attending: Obstetrics and Gynecology | Admitting: Obstetrics and Gynecology

## 2016-11-24 DIAGNOSIS — Z87891 Personal history of nicotine dependence: Secondary | ICD-10-CM | POA: Diagnosis not present

## 2016-11-24 DIAGNOSIS — Z3A39 39 weeks gestation of pregnancy: Secondary | ICD-10-CM

## 2016-11-24 DIAGNOSIS — O26893 Other specified pregnancy related conditions, third trimester: Secondary | ICD-10-CM | POA: Diagnosis present

## 2016-11-24 DIAGNOSIS — Z8759 Personal history of other complications of pregnancy, childbirth and the puerperium: Secondary | ICD-10-CM

## 2016-11-24 LAB — TYPE AND SCREEN
ABO/RH(D): O POS
ANTIBODY SCREEN: NEGATIVE

## 2016-11-24 LAB — CBC
HCT: 35.5 % — ABNORMAL LOW (ref 36.0–46.0)
Hemoglobin: 12.3 g/dL (ref 12.0–15.0)
MCH: 31.7 pg (ref 26.0–34.0)
MCHC: 34.6 g/dL (ref 30.0–36.0)
MCV: 91.5 fL (ref 78.0–100.0)
PLATELETS: 195 10*3/uL (ref 150–400)
RBC: 3.88 MIL/uL (ref 3.87–5.11)
RDW: 13 % (ref 11.5–15.5)
WBC: 13 10*3/uL — AB (ref 4.0–10.5)

## 2016-11-24 LAB — ABO/RH: ABO/RH(D): O POS

## 2016-11-24 MED ORDER — LIDOCAINE HCL (PF) 1 % IJ SOLN
INTRAMUSCULAR | Status: DC | PRN
  Administered 2016-11-24 (×2): 7 mL via EPIDURAL

## 2016-11-24 MED ORDER — OXYTOCIN BOLUS FROM INFUSION
500.0000 mL | Freq: Once | INTRAVENOUS | Status: AC
  Administered 2016-11-24: 500 mL via INTRAVENOUS

## 2016-11-24 MED ORDER — ONDANSETRON HCL 4 MG/2ML IJ SOLN: 4.0000 mg | Freq: Four times a day (QID) | INTRAMUSCULAR | Status: DC | PRN

## 2016-11-24 MED ORDER — PHENYLEPHRINE 40 MCG/ML (10ML) SYRINGE FOR IV PUSH (FOR BLOOD PRESSURE SUPPORT)
80.0000 ug | PREFILLED_SYRINGE | INTRAVENOUS | Status: DC | PRN
  Filled 2016-11-24: qty 5
  Filled 2016-11-24 (×2): qty 10

## 2016-11-24 MED ORDER — IBUPROFEN 600 MG PO TABS
600.0000 mg | ORAL_TABLET | Freq: Four times a day (QID) | ORAL | Status: DC
  Administered 2016-11-24 – 2016-11-26 (×7): 600 mg via ORAL
  Filled 2016-11-24 (×7): qty 1

## 2016-11-24 MED ORDER — LACTATED RINGERS IV SOLN: 500.0000 mL | INTRAVENOUS | Status: DC | PRN

## 2016-11-24 MED ORDER — OXYTOCIN 40 UNITS IN LACTATED RINGERS INFUSION - SIMPLE MED
2.5000 [IU]/h | INTRAVENOUS | Status: DC
  Administered 2016-11-24: 2.5 [IU]/h via INTRAVENOUS
  Filled 2016-11-24: qty 1000

## 2016-11-24 MED ORDER — LIDOCAINE HCL (PF) 1 % IJ SOLN
30.0000 mL | INTRAMUSCULAR | Status: DC | PRN
  Filled 2016-11-24: qty 30

## 2016-11-24 MED ORDER — ACETAMINOPHEN 325 MG PO TABS: 650.0000 mg | ORAL_TABLET | ORAL | Status: DC | PRN

## 2016-11-24 MED ORDER — LACTATED RINGERS IV SOLN
500.0000 mL | Freq: Once | INTRAVENOUS | Status: AC
  Administered 2016-11-24: 500 mL via INTRAVENOUS

## 2016-11-24 MED ORDER — SOD CITRATE-CITRIC ACID 500-334 MG/5ML PO SOLN: 30.0000 mL | ORAL | Status: DC | PRN

## 2016-11-24 MED ORDER — DIPHENHYDRAMINE HCL 50 MG/ML IJ SOLN: 12.5000 mg | INTRAMUSCULAR | Status: DC | PRN

## 2016-11-24 MED ORDER — EPHEDRINE 5 MG/ML INJ
10.0000 mg | INTRAVENOUS | Status: DC | PRN
  Filled 2016-11-24: qty 2

## 2016-11-24 MED ORDER — LACTATED RINGERS IV SOLN
INTRAVENOUS | Status: DC
  Administered 2016-11-24: 125 mL/h via INTRAVENOUS
  Administered 2016-11-24: 04:00:00 via INTRAVENOUS
  Administered 2016-11-24: 125 mL/h via INTRAVENOUS

## 2016-11-24 MED ORDER — FENTANYL 2.5 MCG/ML BUPIVACAINE 1/10 % EPIDURAL INFUSION (WH - ANES)
14.0000 mL/h | INTRAMUSCULAR | Status: DC | PRN
  Administered 2016-11-24 (×3): 14 mL/h via EPIDURAL
  Filled 2016-11-24 (×3): qty 100

## 2016-11-24 MED ORDER — PHENYLEPHRINE 40 MCG/ML (10ML) SYRINGE FOR IV PUSH (FOR BLOOD PRESSURE SUPPORT)
80.0000 ug | PREFILLED_SYRINGE | INTRAVENOUS | Status: DC | PRN
  Filled 2016-11-24: qty 5

## 2016-11-24 MED ORDER — OXYCODONE-ACETAMINOPHEN 5-325 MG PO TABS: 1.0000 | ORAL_TABLET | ORAL | Status: DC | PRN

## 2016-11-24 MED ORDER — OXYCODONE-ACETAMINOPHEN 5-325 MG PO TABS: 2.0000 | ORAL_TABLET | ORAL | Status: DC | PRN

## 2016-11-24 MED ORDER — EPHEDRINE 5 MG/ML INJ
10.0000 mg | INTRAVENOUS | Status: DC | PRN
Start: 2016-11-24 — End: 2016-11-25
  Filled 2016-11-24: qty 2

## 2016-11-24 NOTE — H&P (Signed)
OBSTETRIC ADMISSION HISTORY AND PHYSICAL  Charlene Joseph is a 21 y.o. female G2P0010 with IUP at [redacted]w[redacted]d by LMP presenting for SOL. She reports contractions began yesterday but have since become more frequent and severe.  She reports +FMs, No LOF, no VB, no blurry vision, headaches or peripheral edema, and no RUQ pain.  She plans on breast and bottle feeding. She is undecided for birth control. She received her prenatal care at [facility].  Dating: By LMP --->  Estimated Date of Delivery: 12/01/16  Sono:    , CWD, normal anatomy, cephalic presentation, 1166g, 16% EFW   Prenatal History/Complications:  Clinic [facility] Prenatal Labs  Dating LMP Blood type: O/Positive/-- (05/09 1627)   Genetic Screen  AFP: neg  NIPS:mat21:normal Antibody:Negative (05/09 1627)  Anatomic US  wnl, f/u growth normal Rubella: 2.42 (05/09 1627)  GTT Early:    HA1C= 4.9%        Third trimester: WNL RPR: Non Reactive (07/02 1049)   Flu vaccine  in fall HBsAg: Negative (05/09 1627)   TDaP vaccine    09-26-16                                           Rhogam:n/a O+ HIV:   NR   Baby Food  Breast/Bottle                                            [id number] (08/23 1602)  Contraception POP  Pap: n/a <45yrs  Circumcision Yes, Femina   Pediatrician Dr.Winners [facility]   Support Person FOB-Chris     Past Medical History: Past Medical History:  Diagnosis Date  . History of asthma    patient had as child ,been about 6 to 7 years sine one.   . Syncope 2011    Past Surgical History: Past Surgical History:  Procedure Laterality Date  . DILATION AND CURETTAGE OF UTERUS      Obstetrical History: OB History    Gravida Para Term Preterm AB Living   2       1 0   SAB TAB Ectopic Multiple Live Births     1            Social History: Social History   Social History  . Marital status: Single    Spouse name: N/A  . Number of children: N/A  . Years of education: N/A   Social History Main Topics  . Smoking  status: Former Smoker    Types: Cigarettes    Quit date: 2016  . Smokeless tobacco: Never Used  . Alcohol use No  . 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 Sexual activity: Yes   Other Topics Concern  . None   Social History Narrative  . None    Family History: Family History  Problem Relation Age of Onset  . Diabetes Maternal Grandmother   . Healthy Mother   . Healthy Brother     Allergies: No Known Allergies  Prescriptions Prior to Admission  Medication Sig Dispense Refill Last Dose  . Prenat-FeAsp-Meth-FA-DHA w/o A (PRENATE PIXIE) 10-0.6-0.4-200 MG CAPS Take 1 tablet by mouth daily. 30 capsule 12 11/23/2016 at Unknown time  . hydrOXYzine (VISTARIL)  50 MG capsule Take 1 capsule (50 mg total) by mouth 3 (three) times daily as needed. (Patient not taking: Reported on 11/15/2016) 30 capsule 0 Not Taking  . metroNIDAZOLE (FLAGYL) 500 MG tablet Take 1 tablet (500 mg total) by mouth 2 (two) times daily. (Patient not taking: Reported on 11/22/2016) 14 tablet 0 Not Taking  . Prenatal Vit-Fe Fumarate-FA (MULTIVITAMIN-PRENATAL) 27-0.8 MG TABS tablet Take 1 tablet by mouth daily at 12 noon.   Not Taking     Review of Systems   All systems reviewed and negative except as stated in HPI  Blood pressure 117/83, pulse 92, temperature 98.2 F (36.8 C), temperature source Oral, resp. rate 20, last menstrual period 02/25/2016, SpO2 100 %. General appearance: alert and cooperative Lungs: clear to auscultation bilaterally Heart: regular rate and rhythm Abdomen: soft, non-tender; bowel sounds normal Pelvic: deferred Extremities: Homans sign is negative, no sign of DVT Presentation: cephalic Fetal monitoringBaseline: 140 bpm, Variability: Good {> 6 bpm), Accelerations: Reactive and Decelerations: Absent Uterine activity: q3 min Dilation: 4.5 Effacement (%): 80 Station: -2 Exam by:: Librarian, academic RN   Prenatal labs: ABO, Rh: O/Positive/-- (05/09  1627) Antibody: Negative (05/09 1627) Rubella: 2.42 (05/09 1627) RPR: Non Reactive (07/02 1049)  HBsAg: Negative (05/09 1627)  HIV:   Nonreactive 09/10/16 GBS: Negative (08/23 1602)  2 hr Glucola WNL Genetic screening  AFP WNL Anatomy US WNL  Prenatal Transfer Tool  Maternal Diabetes: No Genetic Screening: Normal Maternal Ultrasounds/Referrals: Normal Fetal Ultrasounds or other Referrals:  None Maternal Substance Abuse:  No Significant Maternal Medications:  None Significant Maternal Lab Results: Lab values include: Group B Strep negative  No results found for this or any previous visit (from the past 24 hour(s)).  Patient Active Problem List   Diagnosis Date Noted  . Supervision of other normal pregnancy, antepartum 07/18/2016    Assessment/Plan:  Charlene Joseph is a 21 y.o. G2P0010 at [redacted]w[redacted]d here for SOL.  #Labor: Expectant management; recheck cervix in 4 hours and augment if needed #Pain: Epidural if desired #FWB: Category 1 #ID:  GBS negative #MOF:  Breast/Bottle #MOC: POPs #Circ:  Desired, outpatient  Larene Beach, DO PGY-2 Family Medicine Resident 11/24/2016, 3:02 AM  CNM attestation:  I have seen and examined this patient; I agree with above documentation in the resident's note.   Charlene Joseph is a 21 y.o. G2P0010 here for SOL  PE: BP 126/81   Pulse 90   Temp 98.9 F (37.2 C) (Oral)   Resp (!) 22   Ht  (1.575 m)   Wt 68.9 kg (152 lb)   LMP 02/25/2016   SpO2 99%   BMI 27.80 kg/m   Resp: normal effort, no distress Abd: gravid  ROS, labs, PMH reviewed  Plan: Ad[facility] to [facility] Expectant management Augment prn Anticipate SVD  Charlene Joseph CNM 11/24/2016, 7:06 AM

## 2016-11-24 NOTE — Anesthesia Pain Management Evaluation Note (Signed)
  [facility] Pain Management Visit Note  Patient: Charlene Joseph, 21 y.o., female  "Hello I am a member of the anesthesia team at [facility]. We have an anesthesia team available at all times to provide care throughout the hospital, including epidural management and anesthesia for C-section. I don't know your plan for the delivery whether it a natural birth, water birth, IV sedation, nitrous supplementation, doula or epidural, but we want to meet your pain goals."   1.Was your pain managed to your expectations on prior hospitalizations?   No prior hospitalizations  2.What is your expectation for pain management during this hospitalization?     Epidural  3.How can we help you reach that goal? unsure  Record the patient's initial score and the patient's pain goal.   Pain: 0  Pain Goal: 8 [facility] wants you to be able to say your pain was always managed very well.  Cephus Shelling 11/24/2016

## 2016-11-24 NOTE — Anesthesia Preprocedure Evaluation (Signed)
Anesthesia Evaluation  Patient identified by MRN, date of birth, ID band Patient awake    Reviewed: Allergy & Precautions, H&P , NPO status , Patient's Chart, lab work & pertinent test results  Airway Mallampati: I  TM Distance: >3 FB Neck ROM: full    Dental no notable dental hx. (+) Teeth Intact   Pulmonary former smoker,    Pulmonary exam normal breath sounds clear to auscultation       Cardiovascular negative cardio ROS Normal cardiovascular exam Rhythm:regular Rate:Normal     Neuro/Psych negative neurological ROS  negative psych ROS   GI/Hepatic negative GI ROS, Neg liver ROS,   Endo/Other  negative endocrine ROS  Renal/GU negative Renal ROS     Musculoskeletal   Abdominal Normal abdominal exam  (+)   Peds  Hematology negative hematology ROS (+)   Anesthesia Other Findings   Reproductive/Obstetrics (+) Pregnancy                             Anesthesia Physical Anesthesia Plan  ASA: II  Anesthesia Plan: Epidural   Post-op Pain Management:    Induction:   PONV Risk Score and Plan:   Airway Management Planned:   Additional Equipment:   Intra-op Plan:   Post-operative Plan:   Informed Consent: I have reviewed the patients History and Physical, chart, labs and discussed the procedure including the risks, benefits and alternatives for the proposed anesthesia with the patient or authorized representative who has indicated his/her understanding and acceptance.       Plan Discussed with:   Anesthesia Plan Comments:         Anesthesia Quick Evaluation  

## 2016-11-24 NOTE — Progress Notes (Signed)
Patient ID: Charlene Joseph, female   DOB: 19-Jun-1995, 21 y.o.   MRN: [id number]  Comfortable w/ epidural BP 126/81, other VSS FHR 130-140s, +accels, no decels Ctx q 2-4 mins, spont Cx 6/80/-2  IUP@term  Early active labor GBS neg  Plan to check cx in 2 hrs or sooner prn Anticipate SVD  Clelia Croft, [facility] 11/24/2016

## 2016-11-24 NOTE — Anesthesia Procedure Notes (Signed)
Epidural Patient location during procedure: OB Start time: 11/24/2016 4:38 AM End time: 11/24/2016 4:42 AM  Staffing Anesthesiologist: Leilani Able Performed: anesthesiologist   Preanesthetic Checklist Completed: patient identified, surgical consent, pre-op evaluation, timeout performed, IV checked, risks and benefits discussed and monitors and equipment checked  Epidural Patient position: sitting Prep: site prepped and draped and DuraPrep Patient monitoring: continuous pulse ox and blood pressure Approach: midline Location: L3-L4 Injection technique: LOR air  Needle:  Needle type: Tuohy  Needle gauge: 17 G Needle length: 9 cm and 9 Needle insertion depth: 5 cm cm Catheter type: closed end flexible Catheter size: 19 Gauge Catheter at skin depth: 10 cm Test dose: negative and Other  Assessment Sensory level: T9 Events: blood not aspirated, injection not painful, no injection resistance, negative IV test and no paresthesia  Additional Notes Reason for block:procedure for pain

## 2016-11-24 NOTE — MAU Note (Signed)
PT SAYS  UC HURT STRONG  SINCE 7PM.   VE IN OFFICE  2 CM .      DENIES  GENITAL HSV  AND MRSA. GBS- NEG

## 2016-11-24 NOTE — Progress Notes (Signed)
Patient ID: Charlene Joseph, female   DOB: [birth year], 21 y.o.   MRN: [id number]  Comfortable w/ epidural BP 119/64, other VSS FHR 150 baseline, +accels, no decels Ctx q 1-2 mins, spont Cx 6/80/-2  IUP@term  Early active labor GBS neg  AROM @ 1051 Plan to check cx in 2 hrs or sooner prn Anticipate SVD  Amanda C. Frances Furbish, MD PGY-1, [facility] 11/24/2016 11:12 AM

## 2016-11-25 LAB — RPR: RPR Ser Ql: NONREACTIVE

## 2016-11-25 MED ORDER — COCONUT OIL OIL: 1.0000 "application " | TOPICAL_OIL | Status: DC | PRN

## 2016-11-25 MED ORDER — INFLUENZA VAC SPLIT QUAD 0.5 ML IM SUSY
0.5000 mL | PREFILLED_SYRINGE | INTRAMUSCULAR | Status: AC
  Administered 2016-11-26: 0.5 mL via INTRAMUSCULAR
  Filled 2016-11-25: qty 0.5

## 2016-11-25 MED ORDER — WITCH HAZEL-GLYCERIN EX PADS
1.0000 "application " | MEDICATED_PAD | CUTANEOUS | Status: DC | PRN
  Administered 2016-11-25: 1 via TOPICAL

## 2016-11-25 MED ORDER — BENZOCAINE-MENTHOL 20-0.5 % EX AERO
1.0000 "application " | INHALATION_SPRAY | CUTANEOUS | Status: DC | PRN
  Administered 2016-11-25: 1 via TOPICAL
  Filled 2016-11-25: qty 56

## 2016-11-25 MED ORDER — SENNOSIDES-DOCUSATE SODIUM 8.6-50 MG PO TABS
2.0000 | ORAL_TABLET | ORAL | Status: DC
  Administered 2016-11-25: 2 via ORAL

## 2016-11-25 MED ORDER — ACETAMINOPHEN 325 MG PO TABS
650.0000 mg | ORAL_TABLET | ORAL | Status: DC | PRN
  Administered 2016-11-25: 650 mg via ORAL
  Filled 2016-11-25: qty 2

## 2016-11-25 MED ORDER — ONDANSETRON HCL 4 MG/2ML IJ SOLN: 4.0000 mg | INTRAMUSCULAR | Status: DC | PRN

## 2016-11-25 MED ORDER — TETANUS-DIPHTH-ACELL PERTUSSIS 5-2.5-18.5 LF-MCG/0.5 IM SUSP: 0.5000 mL | Freq: Once | INTRAMUSCULAR | Status: DC

## 2016-11-25 MED ORDER — SIMETHICONE 80 MG PO CHEW: 80.0000 mg | CHEWABLE_TABLET | ORAL | Status: DC | PRN

## 2016-11-25 MED ORDER — DIPHENHYDRAMINE HCL 25 MG PO CAPS: 25.0000 mg | ORAL_CAPSULE | Freq: Four times a day (QID) | ORAL | Status: DC | PRN

## 2016-11-25 MED ORDER — PRENATAL MULTIVITAMIN CH
1.0000 | ORAL_TABLET | Freq: Every day | ORAL | Status: DC
  Administered 2016-11-25 – 2016-11-26 (×2): 1 via ORAL
  Filled 2016-11-25 (×2): qty 1

## 2016-11-25 MED ORDER — ONDANSETRON HCL 4 MG PO TABS: 4.0000 mg | ORAL_TABLET | ORAL | Status: DC | PRN

## 2016-11-25 MED ORDER — DIBUCAINE 1 % RE OINT: 1.0000 "application " | TOPICAL_OINTMENT | RECTAL | Status: DC | PRN

## 2016-11-25 MED ORDER — ZOLPIDEM TARTRATE 5 MG PO TABS: 5.0000 mg | ORAL_TABLET | Freq: Every evening | ORAL | Status: DC | PRN

## 2016-11-25 NOTE — Plan of Care (Signed)
Problem: Activity: Goal: Ability to tolerate increased activity will improve Outcome: Completed/Met Date Met: 11/25/16 Pt ambulating in the room without difficulty.  Encouraged pt to ambulate in the hallways.  Problem: Pain Management: Goal: General experience of comfort will improve and pain level will decrease Outcome: Progressing Pt reports intermittent cramping pain of a 5/10 that presents when breastfeeding.  Educated pt on cramping with breastfeeding, gave tylenol, and heat packs.  Pt verbalized understanding of teaching.    

## 2016-11-25 NOTE — Clinical Social Work Maternal (Signed)
  CLINICAL SOCIAL WORK MATERNAL/CHILD NOTE  Patient Details  Name: Charlene Joseph MRN: [id number] Date of Birth: April 16, 1995  Date:  11/25/2016  Clinical Social Worker Initiating Note:  Charlene Joseph, MSW, LCSW-A  Date/ Time Initiated:  11/25/16/1230     Child's Name:  Charlene Joseph    Legal Guardian:  Other (Comment) (Not established by court system; MOB and FOB Charlene Joseph DOB 10/25/87) parent collectively in the same household )   Need for Interpreter:  None   Date of Referral:  11/24/16     Reason for Referral:  Current Substance Use/Substance Use During Pregnancy    Referral Source:  RN   Address:  [address] Jan[address] Joseph, [place] [address]  Phone number:  [phone]   Household Members:  Self, Significant Other   Natural Supports (not living in the home):  Friends, Extended Family   Professional Supports: None   Employment: Unemployed   Type of Work: MOB unemployed currently; FOB currently looking for work    Education:  Database administrator Resources:  Medicaid   Other Resources:  [facility]   Cultural/Religious Considerations Which May Impact Care:  None reported.   Strengths:  Ability to meet basic needs , Compliance with medical plan , Home prepared for child , Pediatrician chosen  ([facility] )   Risk Factors/Current Problems:  Substance Use    Cognitive State:  Alert , Able to Concentrate , Insightful , Goal Oriented    Mood/Affect:  Comfortable , Calm , Interested    CSW Assessment: CSW met with MOB at bedside to complete assessment for consult regarding hx of THC use in pregnancy. Upon this writers arrival, MOB was in bed breast feeding baby. This Probation officer explained role and reasoning for visit. MOB verbalized understanding. CSW inquired about THC hx. 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 CSW asked when MOB's last use was. MOB was  unable to recall. CSW informed MOB of the hospitals policy and procedure regarding substance use and mandated reporting. CSW informed MOB that babys UDS was (+) for [facility]; thus, warranting a CPS report to [place]. MOB noted understanding. CSW inquired about DSS hx. MOB denies any hx as this is her first child. This Probation officer offered substance resources; however, MOB denied the need.   This Probation officer made a report to [place] on call CPS worker  Charlene Joseph. At this time, CPS will follow-up with MOB in this hospital OR at home when they discharge.   CSW Plan/Description:  Information/Referral to [employer] , Child Copy Report , Engineer, mining , No Further Intervention Required/No Barriers to Discharge    [employer], MSW, [facility]  Office: [phone]

## 2016-11-25 NOTE — Anesthesia Postprocedure Evaluation (Signed)
Anesthesia Post Note  Patient: Salimah Martinovich  Procedure(s) Performed: * No procedures listed *     Patient location during evaluation: Mother Baby Anesthesia Type: Epidural Level of consciousness: awake and alert Pain management: pain level controlled Vital Signs Assessment: post-procedure vital signs reviewed and stable Respiratory status: spontaneous breathing, nonlabored ventilation and respiratory function stable Cardiovascular status: stable Postop Assessment: no headache, no backache, epidural receding and patient able to bend at knees Anesthetic complications: no    Last Vitals:  Vitals:   11/25/16 0104 11/25/16 0613  BP: 128/78 124/73  Pulse: 75 73  Resp: 18 18  Temp: 37.3 C 36.8 C  SpO2:      Last Pain:  Vitals:   11/25/16 0729  TempSrc:   PainSc: Asleep   Pain Goal: Patients Stated Pain Goal: 3 (11/24/16 2200)               Rica Records

## 2016-11-25 NOTE — Lactation Note (Signed)
This note was copied from a baby's chart. Lactation Consultation Note; Lactation brochure given to mother and basic teaching done. Mother reports taking a Infant care class with breastfeeding teaching. Reviewed cue chart and advised mother to breastfeed infant on cue and at least 8-12 times in 24 hours. Mother was observed independently latching infant on the Rt breast in cradle hold. Infant opened wide with flanged lips. Observed good burst of rhythmic suckling and frequent swallowing. Infant sustained latch for 20 mins. Advised mother to do frequent skin to skin. Mother was informed of all LC services, BFSG'S and outpatient dept. Mother receptive to all teaching.  Patient Name: Charlene Joseph [id number] Date: 11/25/2016 Reason for consult: Initial assessment   Maternal Data Has patient been taught Hand Expression?: Yes Does the patient have breastfeeding experience prior to this delivery?: No  Feeding Feeding Type: Breast Fed Length of feed: 3 min  LATCH Score Latch: Grasps breast easily, tongue down, lips flanged, rhythmical sucking.  Audible Swallowing: Spontaneous and intermittent  Type of Nipple: Everted at rest and after stimulation  Comfort (Breast/Nipple): Soft / non-tender  Hold (Positioning): No assistance needed to correctly position infant at breast.  LATCH Score: 10  Interventions Interventions: Breast feeding basics reviewed;Skin to skin;Hand express;Breast compression  Lactation Tools Discussed/Used     Consult Status Consult Status: Follow-up Date: 11/25/16 Follow-up type: In-patient    Stevan Born [facility] 11/25/2016, 3:39 PM

## 2016-11-25 NOTE — Progress Notes (Signed)
Post Partum Day 1 Subjective: G2P1011 now PPD#1 from VAVD of viable infant female.  Doing well this AM.  Pain is controlled.  Lochia is moderate.   Ambulating with ease.   Tolerating PO.   No concerns.   Objective: Blood pressure 124/73, pulse 73, temperature 98.2 F (36.8 C), temperature source Oral, resp. rate 18, height  (1.575 m), weight 68.9 kg (152 lb), last menstrual period 02/25/2016, SpO2 99 %, unknown if currently breastfeeding.  Physical Exam:  General: alert and cooperative Lochia: appropriate Uterine Fundus: firm Incision: N/A DVT Evaluation: No evidence of DVT seen on physical exam.   Recent Labs  11/24/16 0255  HGB 12.3  HCT 35.5*    Assessment/Plan: G2P1011 now PPD#1 from VAVD of viable infant female.   Continue routine postpartum care.  Breastfeeding. Undecided on contraception.  Plan for discharge tomorrow.   LOS: 1 day   Larene Beach, DO PGY-2 11/25/2016, 7:33 AM

## 2016-11-26 DIAGNOSIS — Z8759 Personal history of other complications of pregnancy, childbirth and the puerperium: Secondary | ICD-10-CM

## 2016-11-26 MED ORDER — IBUPROFEN 600 MG PO TABS: 600.0000 mg | ORAL_TABLET | Freq: Four times a day (QID) | ORAL | 0 refills | Status: DC

## 2016-11-26 MED ORDER — SENNOSIDES-DOCUSATE SODIUM 8.6-50 MG PO TABS: 2.0000 | ORAL_TABLET | Freq: Every evening | ORAL | 0 refills | Status: DC | PRN

## 2016-11-26 NOTE — Discharge Summary (Signed)
OB Discharge Summary     Patient Name: Charlene Joseph DOB: 1996-02-06 MRN: [id number]  Date of admission: 11/24/2016 Delivering MD: Pincus Large   Date of discharge: 11/26/2016  Admitting diagnosis: 39 WKS LABOR Intrauterine pregnancy: [redacted]w[redacted]d     Secondary diagnosis:  Active Problems:   Normal labor   Status post vacuum-assisted vaginal delivery  Additional problems:  Patient Active Problem List   Diagnosis Date Noted  . Status post vacuum-assisted vaginal delivery 11/26/2016  . Normal labor 11/24/2016  . Supervision of other normal pregnancy, antepartum 07/18/2016       Discharge diagnosis: Term Pregnancy Delivered                                                                                                Post partum procedures:none  Augmentation: AROM  Complications: None  Hospital course:  Onset of Labor With Vaginal Delivery     21 y.o. yo G2P1011 at [redacted]w[redacted]d was admitted in Active Labor on 11/24/2016. Patient had an uncomplicated labor course as follows:  Membrane Rupture Time/Date: 10:51 AM ,11/24/2016   Intrapartum Procedures: Episiotomy: None [1]                                         Lacerations:  Labial [10];1st degree [2]  Patient had a delivery of a Viable infant. 11/24/2016  Information for the patient's newborn:  Aryan, Bello [[id number]]  Delivery Method: Vag-Vacuum    Pateint had an uncomplicated postpartum course.  She is ambulating, tolerating a regular diet, passing flatus, and urinating well. Patient is discharged home in stable condition on 11/26/16.   Physical exam  Vitals:   11/25/16 0613 11/25/16 1141 11/25/16 1803 11/26/16 0550  BP: 124/73 133/86 117/68 132/78  Pulse: 73 73 75 63  Resp: Temp: 98.2 F (36.8 C) 97.8 F (36.6 C) 98.7 F (37.1 C) 98.2 F (36.8 C)  TempSrc: Oral Oral Oral Oral  SpO2:  100% 99% 100%  Weight:      Height:       General: alert, cooperative and no distress Lochia: appropriate Uterine  Fundus: firm Incision: N/A DVT Evaluation: No evidence of DVT seen on physical exam. Labs: Lab Results  Component Value Date   WBC 13.0 (H) 11/24/2016   HGB 12.3 11/24/2016   HCT 35.5 (L) 11/24/2016   MCV 91.5 11/24/2016   PLT 195 11/24/2016   CMP Latest Ref Rng & Units 10/25/2016  Glucose 65 - 99 mg/dL 76  BUN 6 - 20 mg/dL 4(L)  Creatinine 7.82 - 1.00 mg/dL 9.56  Sodium 213 - 086 mmol/L 137  Potassium 3.5 - 5.2 mmol/L 4.2  Chloride 96 - 106 mmol/L 104  CO2 20 - 29 mmol/L 20  Calcium 8.7 - 10.2 mg/dL 8.3(L)  Total Protein 6.0 - 8.5 g/dL 6.0  Total Bilirubin 0.0 - 1.2 mg/dL 0.7  Alkaline Phos 39 - 117 IU/L 101  AST 0 - 40 IU/L 39  ALT 0 - 32 IU/L 45(H)  Discharge instruction: per After Visit Summary and "Baby and Me Booklet".  After visit meds:  Allergies as of 11/26/2016   No Known Allergies     Medication List    TAKE these medications   hydrOXYzine 50 MG capsule Commonly known as:  VISTARIL Take 1 capsule (50 mg total) by mouth 3 (three) times daily as needed. What changed:  reasons to take this   ibuprofen 600 MG tablet Commonly known as:  ADVIL,MOTRIN Take 1 tablet (600 mg total) by mouth every 6 (six) hours.   PRENATE PIXIE 10-0.6-0.4-200 MG Caps Take 1 tablet by mouth daily.   senna-docusate 8.6-50 MG tablet Commonly known as:  Senokot-S Take 2 tablets by mouth at bedtime as needed for mild constipation.            Discharge Care Instructions        Start     Ordered   11/26/16 0000  ibuprofen (ADVIL,MOTRIN) 600 MG tablet  Every 6 hours     11/26/16 0921   11/26/16 0000  senna-docusate (SENOKOT-S) 8.6-50 MG tablet  At bedtime PRN     11/26/16 0921      Diet: routine diet  Activity: Advance as tolerated. Pelvic rest for 6 weeks.   Outpatient follow up:6 weeks Follow up Appt:Future Appointments Date Time Provider Department Center  12/24/2016 1:00 PM Roe Coombs, CNM [facility] None   Follow up Visit: Follow-up Information     [facility] [place]. Schedule an appointment as soon as possible for a visit.   Specialty:  Obstetrics and Gynecology Why:  Schedule postpartum visit in 6 weeks Contact information: [address] [place] [address] [phone]          Postpartum contraception: Undecided  Newborn Data: Live born female  Birth Weight: 7 lb 6.9 oz (3371 g) APGAR: 8, 9  Baby Feeding: Bottle and Breast Disposition:home with mother   11/26/2016 Caryl Ada, DO

## 2016-11-26 NOTE — Discharge Instructions (Signed)

## 2016-11-26 NOTE — Lactation Note (Signed)
This note was copied from a baby's chart. Lactation Consultation Note  Patient Name: Charlene Joseph [id number] Date: 11/26/2016 Reason for consult: Follow-up assessment  Baby 37 hours old. Mom reports that the baby nursed for 30 minutes during the last hour. Mom states that baby is latching well, she is hearing swallows while baby at breast and she believes her milk is starting to increase. Mom aware of OP/BFSG and LC phone line assistance after D/C.   Maternal Data    Feeding Feeding Type: Breast Fed Length of feed: 30 min  LATCH Score Latch: Grasps breast easily, tongue down, lips flanged, rhythmical sucking.  Audible Swallowing: A few with stimulation  Type of Nipple: Everted at rest and after stimulation  Comfort (Breast/Nipple): Soft / non-tender  Hold (Positioning): No assistance needed to correctly position infant at breast.  LATCH Score: 9  Interventions    Lactation Tools Discussed/Used     Consult Status Consult Status: PRN    Sherlyn Hay 11/26/2016, 10:24 AM

## 2016-11-26 NOTE — Progress Notes (Signed)
Post discharge chart review completed.  

## 2016-11-29 ENCOUNTER — Encounter: Payer: Medicaid Other | Admitting: Certified Nurse Midwife

## 2016-12-24 ENCOUNTER — Ambulatory Visit: Payer: Medicaid Other | Admitting: Certified Nurse Midwife

## 2016-12-26 ENCOUNTER — Ambulatory Visit (INDEPENDENT_AMBULATORY_CARE_PROVIDER_SITE_OTHER): Payer: Medicaid Other | Admitting: Certified Nurse Midwife

## 2016-12-26 ENCOUNTER — Encounter: Payer: Self-pay | Admitting: Certified Nurse Midwife

## 2016-12-26 VITALS — BP 105/70 | HR 77 | Ht 63.0 in | Wt 125.2 lb

## 2016-12-26 DIAGNOSIS — Z30011 Encounter for initial prescription of contraceptive pills: Secondary | ICD-10-CM

## 2016-12-26 DIAGNOSIS — Z1389 Encounter for screening for other disorder: Secondary | ICD-10-CM | POA: Diagnosis not present

## 2016-12-26 MED ORDER — NORETHINDRONE 0.35 MG PO TABS: 1.0000 | ORAL_TABLET | Freq: Every day | ORAL | 11 refills | Status: DC

## 2016-12-26 NOTE — Progress Notes (Signed)
[facility] is currently breastfeeding and [facility].

## 2016-12-26 NOTE — Progress Notes (Signed)
..  Post Partum Exam  Charlene Joseph is a 21 y.o. 1022P1011 female who presents for a postpartum visit. She is 4 weeks postpartum following a spontaneous vaginal delivery. I have fully reviewed the prenatal and intrapartum course. The delivery was at 39.0 gestational weeks.  Anesthesia: epidural. Postpartum course has been good. Baby's course has been good. Baby is feeding by breast. Bleeding staining only. Bowel function is normal. Bladder function is normal. Patient is not sexually active. Contraception method is abstinence. Postpartum depression screening:neg.  Is currently bleeding: light.   The following portions of the patient's history were reviewed and updated as appropriate: allergies, current medications, past family history, past medical history, past social history, past surgical history and problem list.  Review of Systems Pertinent items noted in HPI and remainder of comprehensive ROS otherwise negative.    Objective:  unknown if currently breastfeeding.  General:  alert, cooperative and no distress   Breasts:  inspection negative, no nipple discharge or bleeding, no masses or nodularity palpable  Lungs: clear to auscultation bilaterally  Heart:  regular rate and rhythm, S1, S2 normal, no murmur, click, rub or gallop  Abdomen: soft, non-tender; bowel sounds normal; no masses,  no organomegaly  Pelvic Exam: Not performed.        Assessment:    Normal 4 week postpartum exam. Pap smear not done at today's visit.   Breast feeding status: excellent  Contraception management  Plan:   1. Contraception: abstinence 2.  POP protocol reviewed.   3. Follow up in: 1 month for annual exam or as needed.

## 2017-01-23 ENCOUNTER — Encounter: Payer: Self-pay | Admitting: Certified Nurse Midwife

## 2017-01-23 ENCOUNTER — Other Ambulatory Visit (HOSPITAL_COMMUNITY)
Admission: RE | Admit: 2017-01-23 | Discharge: 2017-01-23 | Disposition: A | Discharge status: Home / Self Care | Payer: Medicaid Other | Source: Ambulatory Visit | Attending: Certified Nurse Midwife | Admitting: Certified Nurse Midwife

## 2017-01-23 ENCOUNTER — Ambulatory Visit (INDEPENDENT_AMBULATORY_CARE_PROVIDER_SITE_OTHER): Payer: Medicaid Other | Admitting: Certified Nurse Midwife

## 2017-01-23 VITALS — BP 118/71 | HR 81 | Wt 124.0 lb

## 2017-01-23 DIAGNOSIS — Z30011 Encounter for initial prescription of contraceptive pills: Secondary | ICD-10-CM

## 2017-01-23 DIAGNOSIS — Z01411 Encounter for gynecological examination (general) (routine) with abnormal findings: Secondary | ICD-10-CM | POA: Diagnosis not present

## 2017-01-23 DIAGNOSIS — Z0000 Encounter for general adult medical examination without abnormal findings: Secondary | ICD-10-CM | POA: Diagnosis not present

## 2017-01-23 DIAGNOSIS — R102 Pelvic and perineal pain: Secondary | ICD-10-CM | POA: Diagnosis not present

## 2017-01-23 DIAGNOSIS — Z3041 Encounter for surveillance of contraceptive pills: Secondary | ICD-10-CM

## 2017-01-23 DIAGNOSIS — Z01419 Encounter for gynecological examination (general) (routine) without abnormal findings: Secondary | ICD-10-CM | POA: Diagnosis not present

## 2017-01-23 DIAGNOSIS — Z87891 Personal history of nicotine dependence: Secondary | ICD-10-CM | POA: Diagnosis not present

## 2017-01-23 MED ORDER — NORETHINDRONE 0.35 MG PO TABS: 1.0000 | ORAL_TABLET | Freq: Every day | ORAL | 11 refills | Status: DC

## 2017-01-23 NOTE — Progress Notes (Signed)
Subjective:        Charlene Joseph is a 21 y.o. female here for a routine exam.  Current complaints: none, vaginal discharge.  Birth control options discussed.     Personal health questionnaire:  Is patient [name], have a family history of breast and/or ovarian cancer: no Is there a family history of uterine cancer diagnosed at age < 5350, gastrointestinal cancer, urinary tract cancer, family member who is a Personnel officerLynch syndrome-associated carrier: no Is the patient overweight and hypertensive, family history of diabetes, personal history of gestational diabetes, preeclampsia or PCOS: no Is patient over 3055, have PCOS,  family history of premature CHD under age 21, diabetes, smoke, have hypertension or peripheral artery disease:  no At any time, has a partner hit, kicked or otherwise hurt or frightened you?: no Over the past 2 weeks, have you felt down, depressed or hopeless?: no Over the past 2 weeks, have you felt little interest or pleasure in doing things?:no   Gynecologic History No LMP recorded. Contraception: abstinence and oral progesterone-only contraceptive Last Pap: none, 1st pap today. Last mammogram: n/a <40 years.   Obstetric History OB History  Gravida Para Term Preterm AB Living  2 1 1   1 1   SAB TAB Ectopic Multiple Live Births    1   0 1    # Outcome Date GA Lbr Len/2nd Weight Sex Delivery Anes PTL Lv  2 Term 11/24/16 3325w0d 17:06 / 04:03 7 lb 6.9 oz (3.371 kg) M Vag-Vacuum EPI  LIV     Birth Comments: WNL  1 TAB 2016 6891w0d             Past Medical History:  Diagnosis Date  . History of asthma    patient had as child ,been about 6 to 7 years sine one.   . Syncope 2011    Past Surgical History:  Procedure Laterality Date  . DILATION AND CURETTAGE OF UTERUS       Current Outpatient Medications:  .  norethindrone (MICRONOR,CAMILA,ERRIN) 0.35 MG tablet, Take 1 tablet (0.35 mg total) daily by mouth., Disp: 1 Package, Rfl: 11 .  Prenat-FeAsp-Meth-FA-DHA  w/o A (PRENATE PIXIE) 10-0.6-0.4-200 MG CAPS, Take 1 tablet by mouth daily., Disp: 30 capsule, Rfl: 12 No Known Allergies  Social History   Tobacco Use  . Smoking status: Former Smoker    Types: Cigarettes    Last attempt to quit: 2016    Years since quitting: 2.8  . Smokeless tobacco: Never Used  Substance Use Topics  . Alcohol use: No    Family History  Problem Relation Age of Onset  . Diabetes Maternal Grandmother   . Healthy Mother   . Healthy Brother       Review of Systems  Constitutional: negative for fatigue and weight loss Respiratory: negative for cough and wheezing Cardiovascular: negative for chest pain, fatigue and palpitations Gastrointestinal: negative for abdominal pain and change in bowel habits Musculoskeletal:negative for myalgias Neurological: negative for gait problems and tremors Behavioral/Psych: negative for abusive relationship, depression Endocrine: negative for temperature intolerance    Genitourinary:negative for abnormal menstrual periods, genital lesions, hot flashes, sexual problems and vaginal discharge Integument/breast: negative for breast lump, breast tenderness, nipple discharge and skin lesion(s)    Objective:       BP 118/71   Pulse 81   Wt 124 lb (56.2 kg)   BMI 21.97 kg/m  General:   alert  Skin:   no rash or abnormalities  Lungs:  clear to auscultation bilaterally  Heart:   regular rate and rhythm, S1, S2 normal, no murmur, click, rub or gallop  Breasts:   normal without suspicious masses, skin or nipple changes or axillary nodes  Abdomen:  normal findings: no organomegaly, soft, non-tender and no hernia  Pelvis:  External genitalia: normal general appearance Urinary system: urethral meatus normal and bladder without fullness, nontender Vaginal: normal without tenderness, induration or masses Cervix: normal appearance Adnexa: normal bimanual exam Uterus: anteverted and non-tender, normal size   Lab Review Urine  pregnancy test Labs reviewed yes Radiologic studies reviewed no  50% of 30 min visit spent on counseling and coordination of care.    Assessment & Plan    Healthy female exam.   Breast feeding status: excellent  Some vaginal discomfort lingering since delivery: sitz baths encouraged   Education reviewed: calcium supplements, depression evaluation, low fat, low cholesterol diet, safe sex/STD prevention, self breast exams, skin cancer screening and weight bearing exercise. Contraception: abstinence and oral progesterone-only contraceptive. Follow up in: 1 year.   Meds ordered this encounter  Medications  . norethindrone (MICRONOR,CAMILA,ERRIN) 0.35 MG tablet    Sig: Take 1 tablet (0.35 mg total) daily by mouth.    Dispense:  1 Package    Refill:  11

## 2017-01-24 ENCOUNTER — Encounter: Payer: Self-pay | Admitting: Certified Nurse Midwife

## 2017-01-24 LAB — CERVICOVAGINAL ANCILLARY ONLY
Bacterial vaginitis: NEGATIVE
Candida vaginitis: NEGATIVE
Chlamydia: NEGATIVE
Neisseria Gonorrhea: NEGATIVE
TRICH (WINDOWPATH): NEGATIVE

## 2017-01-28 LAB — CYTOLOGY - PAP: Diagnosis: NEGATIVE

## 2017-01-29 ENCOUNTER — Other Ambulatory Visit: Payer: Self-pay | Admitting: Certified Nurse Midwife

## 2017-03-06 ENCOUNTER — Telehealth: Payer: Self-pay | Admitting: Pediatrics

## 2018-10-23 ENCOUNTER — Other Ambulatory Visit (HOSPITAL_COMMUNITY)
Admission: RE | Admit: 2018-10-23 | Discharge: 2018-10-23 | Disposition: A | Discharge status: Home / Self Care | Payer: Medicaid Other | Source: Ambulatory Visit | Attending: Obstetrics | Admitting: Obstetrics

## 2018-10-23 ENCOUNTER — Ambulatory Visit: Payer: Medicaid Other | Admitting: Obstetrics

## 2018-10-23 ENCOUNTER — Encounter: Payer: Self-pay | Admitting: Obstetrics

## 2018-10-23 ENCOUNTER — Other Ambulatory Visit: Payer: Self-pay

## 2018-10-23 VITALS — BP 128/86 | HR 72 | Wt 125.8 lb

## 2018-10-23 DIAGNOSIS — Z01419 Encounter for gynecological examination (general) (routine) without abnormal findings: Secondary | ICD-10-CM | POA: Insufficient documentation

## 2018-10-23 DIAGNOSIS — Z30011 Encounter for initial prescription of contraceptive pills: Secondary | ICD-10-CM

## 2018-10-23 DIAGNOSIS — T192XXA Foreign body in vulva and vagina, initial encounter: Secondary | ICD-10-CM

## 2018-10-23 DIAGNOSIS — N898 Other specified noninflammatory disorders of vagina: Secondary | ICD-10-CM | POA: Insufficient documentation

## 2018-10-23 DIAGNOSIS — Z3009 Encounter for other general counseling and advice on contraception: Secondary | ICD-10-CM

## 2018-10-23 DIAGNOSIS — Z3202 Encounter for pregnancy test, result negative: Secondary | ICD-10-CM

## 2018-10-23 LAB — POCT URINE PREGNANCY: Preg Test, Ur: NEGATIVE

## 2018-10-23 MED ORDER — LEVONORGESTREL-ETHINYL ESTRAD 0.15-30 MG-MCG PO TABS: 1.0000 | ORAL_TABLET | Freq: Every day | ORAL | 11 refills | Status: AC

## 2018-10-23 NOTE — Progress Notes (Signed)
Subjective:        Charlene Joseph is a 23 y.o. female here for a routine exam.  Current complaints: Husband feels a " bump " in vagina with intercourse.    Personal health questionnaire:  Is patient Charlene Joseph Jewish, have a family history of breast and/or ovarian cancer: no Is there a family history of uterine cancer diagnosed at age < 50, gastrointestinal cancer, urinary tract cancer, family member who is a Field seismologist syndrome-associated carrier: no Is the patient overweight and hypertensive, family history of diabetes, personal history of gestational diabetes, preeclampsia or PCOS: no Is patient over 72, have PCOS,  family history of premature CHD under age 18, diabetes, smoke, have hypertension or peripheral artery disease:  no At any time, has a partner hit, kicked or otherwise hurt or frightened you?: no Over the past 2 weeks, have you felt down, depressed or hopeless?: no Over the past 2 weeks, have you felt little interest or pleasure in doing things?:no   Gynecologic History Patient's last menstrual period was 09/29/2018 (lmp unknown). Contraception: abstinence Last Pap: 01-23-2017. Results were: normal Last mammogram: n/a. Results were: n/a  Obstetric History OB History  Gravida Para Term Preterm AB Living  2 1 1   1 1   SAB TAB Ectopic Multiple Live Births    1   0 1    # Outcome Date GA Lbr Len/2nd Weight Sex Delivery Anes PTL Lv  2 Term 11/24/16 [redacted]w[redacted]d 17:06 / 04:03 7 lb 6.9 oz (3.371 kg) M Vag-Vacuum EPI  LIV     Birth Comments: WNL  1 TAB 2016 [redacted]w[redacted]d           Past Medical History:  Diagnosis Date  . History of asthma    patient had as child ,been about 6 to 7 years sine one.   . Syncope 2011    Past Surgical History:  Procedure Laterality Date  . DILATION AND CURETTAGE OF UTERUS       Current Outpatient Medications:  .  levonorgestrel-ethinyl estradiol (KURVELO) 0.15-30 MG-MCG tablet, Take 1 tablet by mouth daily., Disp: 1 Package, Rfl: 11 No Known Allergies   Social History   Tobacco Use  . Smoking status: Former Smoker    Types: Cigarettes    Quit date: 2016    Years since quitting: 4.6  . Smokeless tobacco: Never Used  Substance Use Topics  . Alcohol use: No    Family History  Problem Relation Age of Onset  . Diabetes Maternal Grandmother   . Healthy Mother   . Healthy Brother       Review of Systems  Constitutional: negative for fatigue and weight loss Respiratory: negative for cough and wheezing Cardiovascular: negative for chest pain, fatigue and palpitations Gastrointestinal: negative for abdominal pain and change in bowel habits Musculoskeletal:negative for myalgias Neurological: negative for gait problems and tremors Behavioral/Psych: negative for abusive relationship, depression Endocrine: negative for temperature intolerance    Genitourinary:negative for abnormal menstrual periods, genital lesions, hot flashes, sexual problems and vaginal discharge Integument/breast: negative for breast lump, breast tenderness, nipple discharge and skin lesion(s)    Objective:       BP 128/86   Pulse 72   Wt 125 lb 12.8 oz (57.1 kg)   LMP 09/29/2018 (LMP Unknown)   BMI 22.28 kg/m  General:   alert  Skin:   no rash or abnormalities  Lungs:   clear to auscultation bilaterally  Heart:   regular rate and rhythm, S1, S2 normal, no murmur,  click, rub or gallop  Breasts:   normal without suspicious masses, skin or nipple changes or axillary nodes  Abdomen:  normal findings: no organomegaly, soft, non-tender and no hernia  Pelvis:  External genitalia: normal general appearance Urinary system: urethral meatus normal and bladder without fullness, nontender Vaginal: normal without tenderness, induration or masses               retained tampon, removed intact Cervix: normal appearance Adnexa: normal bimanual exam Uterus: anteverted and non-tender, normal size   Lab Review Urine pregnancy test Labs reviewed yes Radiologic studies  reviewed no  50% of 25 min visit spent on counseling and coordination of care.   Assessment:     1. Encounter for gynecological examination with Papanicolaou smear of cervix Rx: - POCT urine pregnancy  2. Vaginal discharge Rx: - Cervicovaginal ancillary only( [facility])  3. General counseling and advice on female contraception - wants OCP's  4. Encounter for initial prescription of contraceptive pills Rx: - levonorgestrel-ethinyl estradiol (KURVELO) 0.15-30 MG-MCG tablet; Take 1 tablet by mouth daily.  Dispense: 1 Package; Refill: 11  5. Retained tampon, initial encounter - tampon removed, intact   Plan:    Education reviewed: calcium supplements, depression evaluation, low fat, low cholesterol diet, safe sex/STD prevention, self breast exams and weight bearing exercise. Contraception: OCP (estrogen/progesterone). Follow up in: 1 year.   Meds ordered this encounter  Medications  . levonorgestrel-ethinyl estradiol (KURVELO) 0.15-30 MG-MCG tablet    Sig: Take 1 tablet by mouth daily.    Dispense:  1 Package    Refill:  11   Orders Placed This Encounter  Procedures  . POCT urine pregnancy    Assciate with Z32.02 (negative pregnancy test). If positive, switch to Z32.01 (positive pregnancy test)     Brock BadHarper, Lynnann Knudsen A, MD 10/23/2018 12:08 PM

## 2018-10-23 NOTE — Progress Notes (Signed)
Here to discuss starting  birth control pills.  Request pregnancy test Last intercourse 2-3 days ago. Last pap 01/2017-normal

## 2018-10-23 NOTE — Addendum Note (Signed)
Addended by: Maryruth Eve on: 10/23/2018 03:56 PM   Modules accepted: Orders

## 2018-10-25 LAB — CERVICOVAGINAL ANCILLARY ONLY
Bacterial vaginitis: POSITIVE — AB
Candida vaginitis: NEGATIVE
Chlamydia: NEGATIVE
Neisseria Gonorrhea: NEGATIVE
Trichomonas: NEGATIVE

## 2018-10-27 ENCOUNTER — Other Ambulatory Visit: Payer: Self-pay | Admitting: Obstetrics

## 2018-10-27 DIAGNOSIS — B9689 Other specified bacterial agents as the cause of diseases classified elsewhere: Secondary | ICD-10-CM

## 2018-10-27 MED ORDER — TINIDAZOLE 500 MG PO TABS: 1000.0000 mg | ORAL_TABLET | Freq: Every day | ORAL | 2 refills | Status: AC

## 2018-10-28 IMAGING — US US MFM OB FOLLOW-UP
1 series · 12 of 28 positions shown · non-contrast
Comparison: none

[Series 1: us mfm ob follow-up · 12 of 41 slices shown]
[im 2/41]
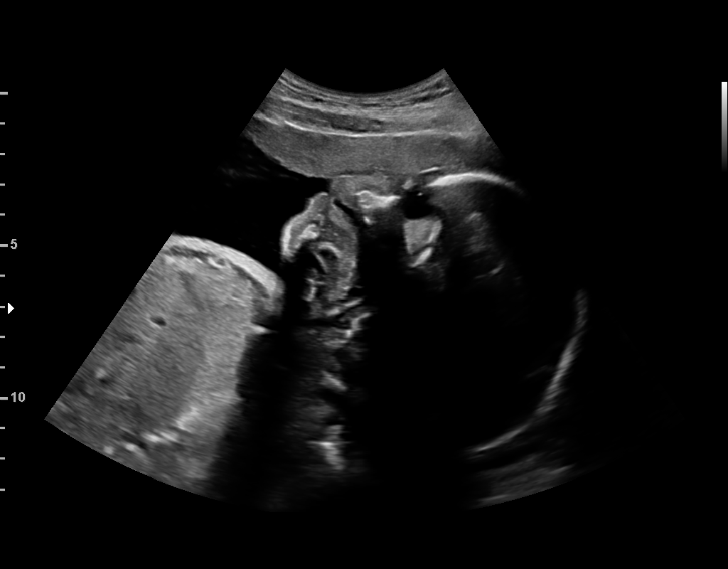
[im 5/41]
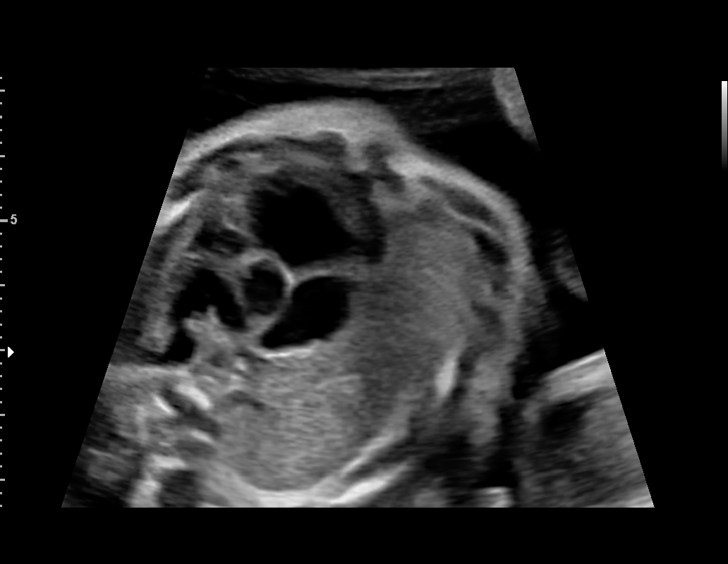
[im 8/41]
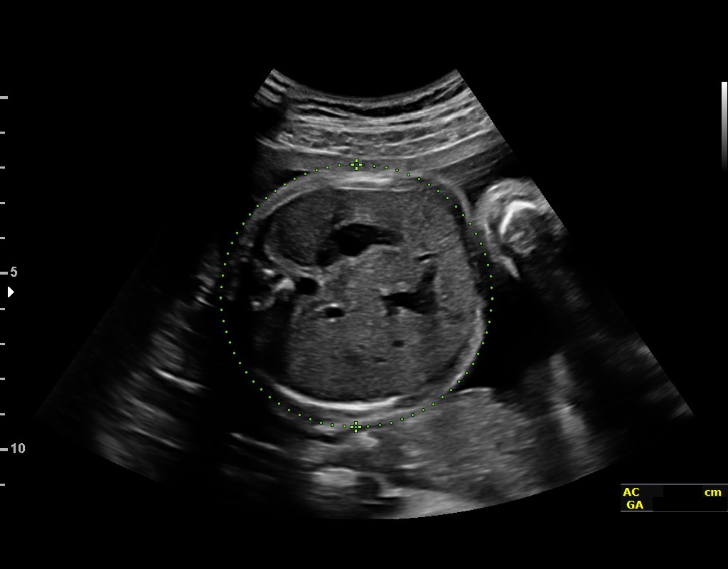
[im 12/41]
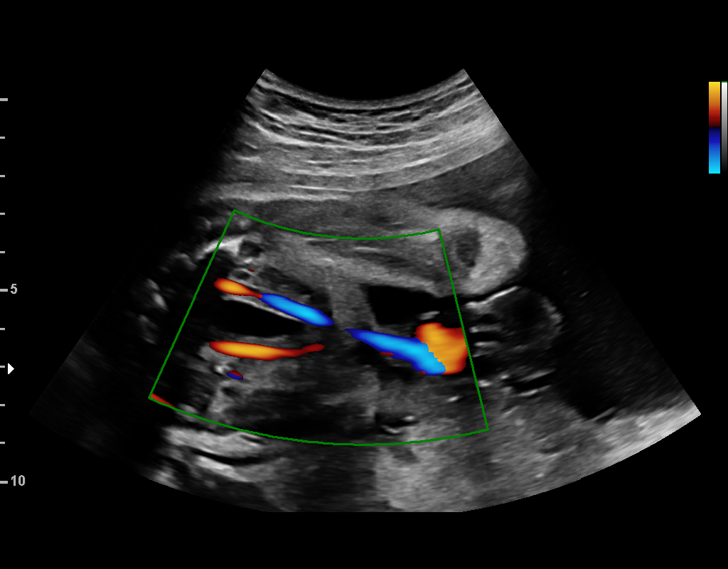
[im 15/41]
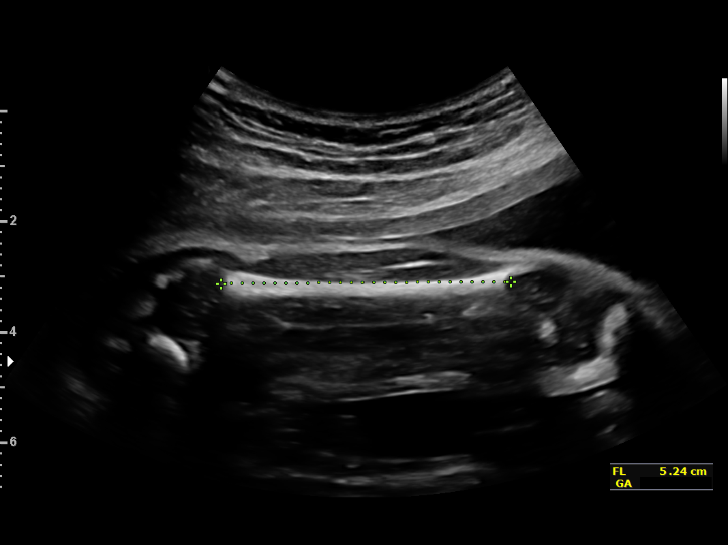
[im 18/41]
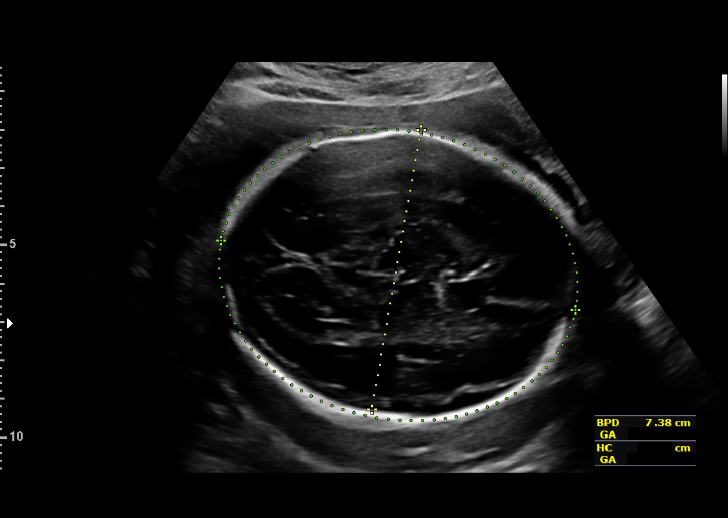
[im 23/41]
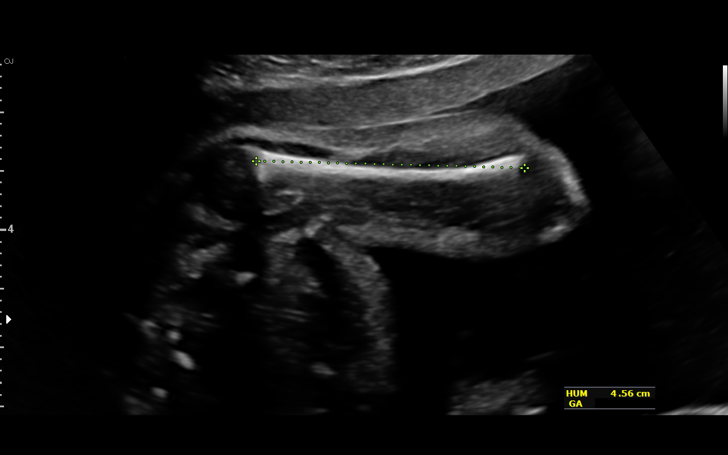
[im 26/41]
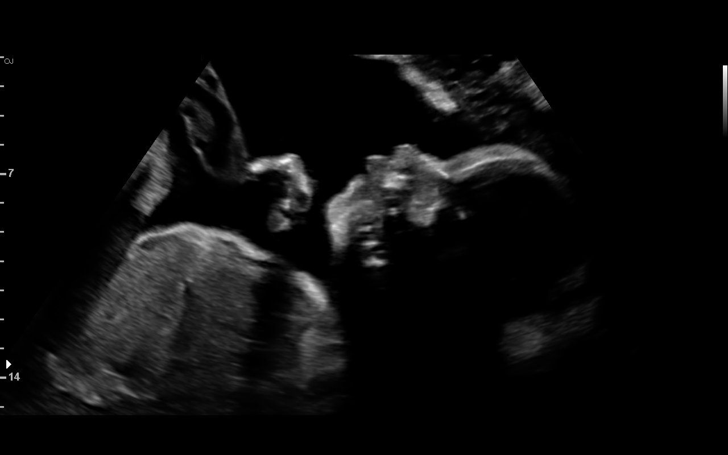
[im 29/41]
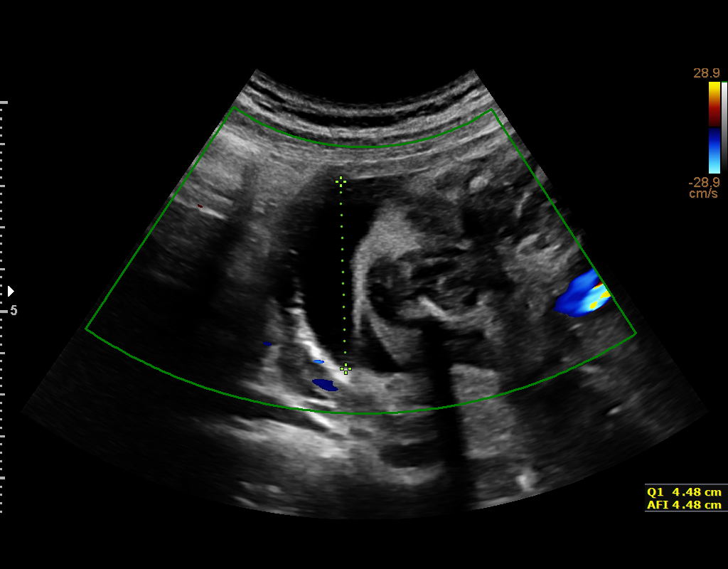
[im 33/41]
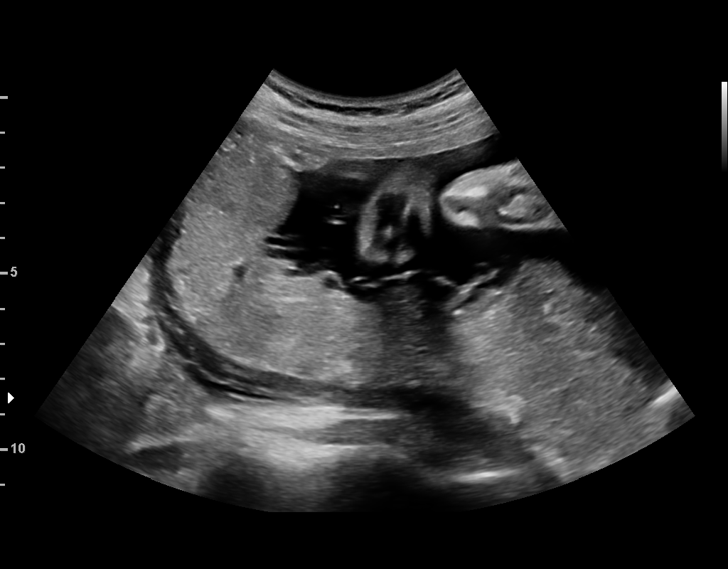
[im 36/41]
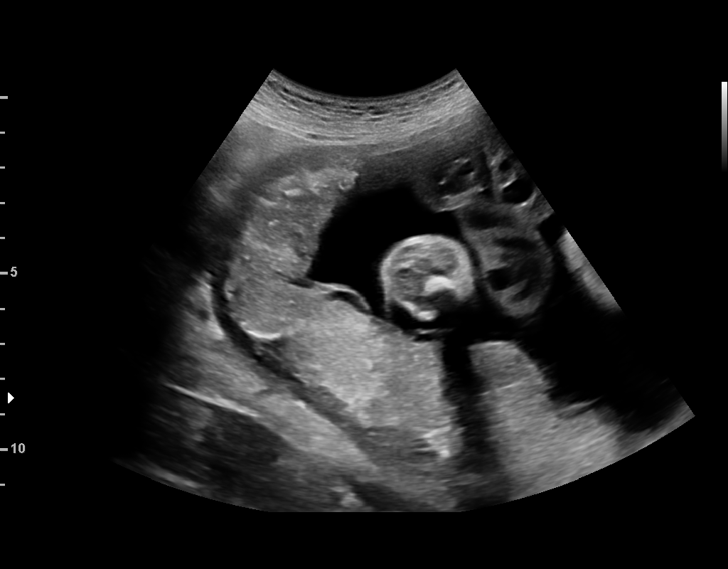
[im 39/41]
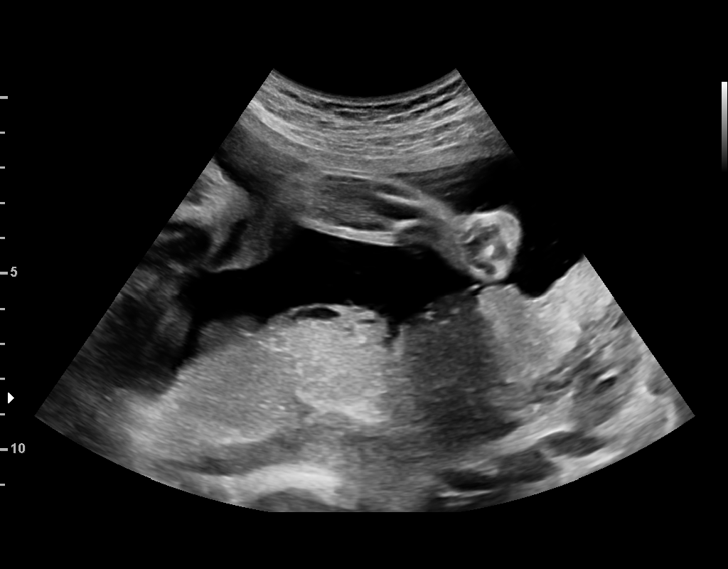

[12 of 28 positions shown; findings below may reference images not displayed]

([HOSPITAL])
[REDACTED] [HOSPITAL]

Indications

28 weeks gestation of pregnancy
Encounter for other antenatal screening
follow-up
Late to prenatal care, third trimester
OB History

Blood Type:            Height:  5'2"   Weight (lb):  134       BMI:
Gravidity:    2         Term:   0        Prem:   0        SAB:   0
TOP:          1       Ectopic:  0        Living: 0
Fetal Evaluation

Num Of Fetuses:     1
Fetal Heart         173
Rate(bpm):
Cardiac Activity:   Observed
Presentation:       Cephalic
Placenta:           Posterior, above cervical os
P. Cord Insertion:  Previously Visualized
Amniotic Fluid
AFI FV:      Subjectively within normal limits

AFI Sum(cm)     %Tile       Largest Pocket(cm)
13.53           41

RUQ(cm)       RLQ(cm)       LUQ(cm)        LLQ(cm)
4.48
Biometry

BPD:      73.4  mm     G. Age:  29w 3d         71  %    CI:        77.36   %    70 - 86
FL/HC:      19.5   %    18.8 -
HC:      264.2  mm     G. Age:  28w 5d         28  %    HC/AC:      1.11        1.05 -
AC:      237.5  mm     G. Age:  28w 0d         32  %    FL/BPD:     70.3   %    71 - 87
FL:       51.6  mm     G. Age:  27w 4d         15  %    FL/AC:      21.7   %    20 - 24
HUM:      45.1  mm     G. Age:  26w 5d          8  %
CER:      36.3  mm     G. Age:  31w 2d         94  %

Est. FW:    4411  gm      2 lb 9 oz     43  %
Gestational Age

LMP:           28w 3d        Date:  02/25/16                 EDD:   12/01/16
U/S Today:     28w 3d                                        EDD:   12/01/16
Best:          28w 3d     Det. By:  LMP  (02/25/16)          EDD:   12/01/16
Anatomy

Cranium:               Appears normal         Aortic Arch:            Previously seen
Cavum:                 Appears normal         Ductal Arch:            Previously seen
Ventricles:            Appears normal         Diaphragm:              Previously seen
Choroid Plexus:        Previously seen        Stomach:                Appears normal, left
sided
Cerebellum:            Appears normal         Abdomen:                Appears normal
Posterior Fossa:       Appears normal         Abdominal Wall:         Previously seen
Nuchal Fold:           Not applicable (>20    Cord Vessels:           Appears normal (3
wks GA)                                        vessel cord)
Face:                  Orbits and profile     Kidneys:                Appear normal
previously seen
Lips:                  Previously seen        Bladder:                Appears normal
Thoracic:              Appears normal         Spine:                  Previously seen
Heart:                 Appears normal         Upper Extremities:      Previously seen
(4CH, axis, and situs
RVOT:                  Appears normal         Lower Extremities:      Previously seen
LVOT:                  Appears normal

Other:  Fetus appears to be a male. Heels and Right 5th digit visualized.
Nasal bone visualized. Technically difficult due to fetal position.
Cervix Uterus Adnexa

Cervix
Not visualized (advanced GA >74wks)

Uterus
No abnormality visualized.

Left Ovary
Not visualized. No adnexal mass visualized.
Right Ovary
Not visualized. No adnexal mass visualized.

Cul De Sac:   No free fluid seen.

Adnexa:       No abnormality visualized.
Impression

Singleton intrauterine pregnancy at 22+2 weeks, here for
growth evaluation
Interval review of the anatomy shows no sonographic
markers for aneuploidy or structural anomalies
Amniotic fluid volume is normal
Estimated fetal weight is 1166g which is growth in the 43rd
percentile
Recommendations

Growth is stable over last 6 weeks
Follow-up ultrasounds as clinically indicated.

## 2018-10-29 ENCOUNTER — Other Ambulatory Visit: Payer: Medicaid Other

## 2018-10-29 LAB — CYTOLOGY - PAP
Diagnosis: UNDETERMINED — AB
HPV: NOT DETECTED
